# Patient Record
Sex: Male | Born: 1995
Health system: Southern US, Community
[De-identification: ages and names within clinical notes are randomized; demographics above are authoritative.]

## PROBLEM LIST (undated history)

## (undated) ENCOUNTER — Ambulatory Visit: Disposition: A | Discharge status: Home / Self Care | Payer: Self-pay

## (undated) DIAGNOSIS — J45909 Unspecified asthma, uncomplicated: Secondary | ICD-10-CM

## (undated) DIAGNOSIS — R002 Palpitations: Secondary | ICD-10-CM

## (undated) DIAGNOSIS — R079 Chest pain, unspecified: Secondary | ICD-10-CM

## (undated) DIAGNOSIS — F419 Anxiety disorder, unspecified: Secondary | ICD-10-CM

## (undated) HISTORY — DX: Palpitations: R00.2

## (undated) HISTORY — DX: Chest pain, unspecified: R07.9

---
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
## 2014-10-26 DIAGNOSIS — S62346A Nondisplaced fracture of base of fifth metacarpal bone, right hand, initial encounter for closed fracture: Secondary | ICD-10-CM | POA: Insufficient documentation

## 2016-11-07 DIAGNOSIS — S62339A Displaced fracture of neck of unspecified metacarpal bone, initial encounter for closed fracture: Secondary | ICD-10-CM | POA: Insufficient documentation

## 2018-04-11 DIAGNOSIS — T7840XA Allergy, unspecified, initial encounter: Secondary | ICD-10-CM | POA: Diagnosis not present

## 2018-04-11 DIAGNOSIS — Y999 Unspecified external cause status: Secondary | ICD-10-CM | POA: Diagnosis not present

## 2018-04-11 DIAGNOSIS — X58XXXA Exposure to other specified factors, initial encounter: Secondary | ICD-10-CM | POA: Diagnosis not present

## 2018-04-11 DIAGNOSIS — R0602 Shortness of breath: Secondary | ICD-10-CM | POA: Diagnosis not present

## 2018-05-07 DIAGNOSIS — J189 Pneumonia, unspecified organism: Secondary | ICD-10-CM | POA: Diagnosis not present

## 2018-05-07 DIAGNOSIS — R0789 Other chest pain: Secondary | ICD-10-CM | POA: Diagnosis not present

## 2018-05-07 DIAGNOSIS — J181 Lobar pneumonia, unspecified organism: Secondary | ICD-10-CM | POA: Diagnosis not present

## 2018-05-07 DIAGNOSIS — M25512 Pain in left shoulder: Secondary | ICD-10-CM | POA: Diagnosis not present

## 2018-05-09 DIAGNOSIS — R079 Chest pain, unspecified: Secondary | ICD-10-CM | POA: Diagnosis not present

## 2018-07-11 DIAGNOSIS — S93401A Sprain of unspecified ligament of right ankle, initial encounter: Secondary | ICD-10-CM | POA: Diagnosis not present

## 2018-07-26 DIAGNOSIS — S93401A Sprain of unspecified ligament of right ankle, initial encounter: Secondary | ICD-10-CM | POA: Diagnosis not present

## 2018-08-01 DIAGNOSIS — S86311A Strain of muscle(s) and tendon(s) of peroneal muscle group at lower leg level, right leg, initial encounter: Secondary | ICD-10-CM | POA: Diagnosis not present

## 2018-09-09 ENCOUNTER — Emergency Department
Admission: EM | Admit: 2018-09-09 | Discharge: 2018-09-10 | Disposition: A | Discharge status: Home / Self Care | Payer: Self-pay | Attending: Emergency Medicine | Admitting: Emergency Medicine

## 2018-09-09 ENCOUNTER — Other Ambulatory Visit: Payer: Self-pay

## 2018-09-09 ENCOUNTER — Encounter: Payer: Self-pay | Admitting: Emergency Medicine

## 2018-09-09 DIAGNOSIS — R079 Chest pain, unspecified: Secondary | ICD-10-CM | POA: Insufficient documentation

## 2018-09-09 DIAGNOSIS — Z5321 Procedure and treatment not carried out due to patient leaving prior to being seen by health care provider: Secondary | ICD-10-CM | POA: Insufficient documentation

## 2018-09-09 HISTORY — DX: Unspecified asthma, uncomplicated: J45.909

## 2018-09-09 NOTE — ED Triage Notes (Signed)
Patient with complaint of left side chest pain with numbness and tingling to left arm and hand times four days. Patient denies shortness of breath and nausea/vomiting.

## 2018-09-10 LAB — CBC
HCT: 45.5 % (ref 39.0–52.0)
Hemoglobin: 15.6 g/dL (ref 13.0–17.0)
MCH: 31.5 pg (ref 26.0–34.0)
MCHC: 34.3 g/dL (ref 30.0–36.0)
MCV: 91.9 fL (ref 80.0–100.0)
Platelets: 185 10*3/uL (ref 150–400)
RBC: 4.95 MIL/uL (ref 4.22–5.81)
RDW: 13.4 % (ref 11.5–15.5)
WBC: 10.8 10*3/uL — ABNORMAL HIGH (ref 4.0–10.5)
nRBC: 0 % (ref 0.0–0.2)

## 2018-09-10 LAB — BASIC METABOLIC PANEL
Anion gap: 9 (ref 5–15)
BUN: 14 mg/dL (ref 6–20)
CO2: 24 mmol/L (ref 22–32)
Calcium: 8.9 mg/dL (ref 8.9–10.3)
Chloride: 107 mmol/L (ref 98–111)
Creatinine, Ser: 0.79 mg/dL (ref 0.61–1.24)
GFR calc Af Amer: >60 mL/min (ref >60)
GFR calc non Af Amer: >60 mL/min (ref >60)
Glucose, Bld: 95 mg/dL (ref 70–99)
Potassium: 3.8 mmol/L (ref 3.5–5.1)
Sodium: 140 mmol/L (ref 135–145)

## 2018-09-10 LAB — TROPONIN I (HIGH SENSITIVITY): Troponin I (High Sensitivity): 6 ng/L (ref <18)

## 2018-09-10 NOTE — ED Notes (Signed)
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 Pt to stay reports will come back if he needs to. [phone]

## 2018-11-02 ENCOUNTER — Ambulatory Visit
Admission: EM | Admit: 2018-11-02 | Discharge: 2018-11-02 | Disposition: A | Discharge status: Home / Self Care | Payer: BC Managed Care – PPO | Attending: Physician Assistant | Admitting: Physician Assistant

## 2018-11-02 ENCOUNTER — Ambulatory Visit (INDEPENDENT_AMBULATORY_CARE_PROVIDER_SITE_OTHER): Payer: BC Managed Care – PPO

## 2018-11-02 DIAGNOSIS — Z1159 Encounter for screening for other viral diseases: Secondary | ICD-10-CM

## 2018-11-02 DIAGNOSIS — R509 Fever, unspecified: Secondary | ICD-10-CM | POA: Diagnosis not present

## 2018-11-02 DIAGNOSIS — R059 Cough, unspecified: Secondary | ICD-10-CM

## 2018-11-02 DIAGNOSIS — F17200 Nicotine dependence, unspecified, uncomplicated: Secondary | ICD-10-CM | POA: Diagnosis not present

## 2018-11-02 DIAGNOSIS — R05 Cough: Secondary | ICD-10-CM | POA: Diagnosis not present

## 2018-11-02 DIAGNOSIS — R0789 Other chest pain: Secondary | ICD-10-CM | POA: Diagnosis not present

## 2018-11-02 MED ORDER — ONDANSETRON 4 MG PO TBDP: 4.0000 mg | ORAL_TABLET | Freq: Three times a day (TID) | ORAL | 0 refills | Status: DC | PRN

## 2018-11-02 NOTE — ED Triage Notes (Signed)
Pt c/o n/v with fever since Monday. Able to keep fluids down only.

## 2018-11-02 NOTE — ED Provider Notes (Signed)
[name]    CSN: [id number] Arrival date & time: 11/02/18  1356      History   Chief Complaint Chief Complaint  Patient presents with  . Emesis    HPI Stephen Burnett is a 23 y.o. male.   23 year old male comes in for 3-day history of URI symptoms, nausea, vomiting.  Patient states due to smoking, he has baseline cough, but has been slightly worse for the past 3 days with productive cough.  Has mild nasal congestion without sore throat.  T-max 100.4.  Denies chills or body aches.  Denies shortness of breath, wheezing. However, with pleuritic chest pain. States felt this way when he was diagnosed with pneumonia earlier this year.  He has also had nausea with 3 episodes of nonbilious nonbloody vomit.  He has since been able to tolerate fluids.  Denies diarrhea.  Had generalized abdominal pain with vomiting, which has since resolved. Denies sick/COVID contact. 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      Past Medical History:  Diagnosis Date  . Asthma     There are no active problems to display for this patient.   History reviewed. No pertinent surgical history.     Home Medications    Prior to Admission medications   Medication Sig Start Date End Date Taking? Authorizing Provider  ondansetron (ZOFRAN ODT) 4 MG disintegrating tablet Take 1 tablet (4 mg total) by mouth every 8 (eight) hours as needed for nausea or vomiting. 11/02/18   Ok Edwards, PA-C    Family History No family history on file.  Social History Social History   Tobacco Use  . Smoking status: Current Every Day Smoker  . Smokeless tobacco: Current User  Substance Use Topics  . Alcohol use: Yes    Comment: occ  . Drug use: Not Currently    Types: Marijuana, Methamphetamines     Allergies   Banana   Review of Systems Review of Systems  Reason unable to perform ROS: See HPI as above.     Physical Exam Triage Vital Signs ED Triage Vitals  Enc Vitals Group     BP 11/02/18 1417  135/80     Pulse Rate 11/02/18 1417 64     Resp 11/02/18 1417 16     Temp 11/02/18 1417 98.2 F (36.8 C)     Temp Source 11/02/18 1417 Oral     SpO2 11/02/18 1417 98 %     Weight --      Height --      Head Circumference --      Peak Flow --      Pain Score 11/02/18 1418 0     Pain Loc --      Pain Edu? --      Excl. in [place]? --    No data found.  Updated Vital Signs BP 135/80 (BP Location: Left Arm)   Pulse 64   Temp 98.2 F (36.8 C) (Oral)   Resp 16   SpO2 98%   Physical Exam Constitutional:      General: He is not in acute distress.    Appearance: Normal appearance. He is not ill-appearing, toxic-appearing or diaphoretic.  HENT:     Head: Normocephalic and atraumatic.     Mouth/Throat:     Mouth: Mucous membranes are moist.     Pharynx: Oropharynx is clear. Uvula midline.  Neck:     Musculoskeletal: Normal range of motion and neck supple.  Cardiovascular:  Rate and Rhythm: Normal rate and regular rhythm.     Heart sounds: Normal heart sounds. No murmur. No friction rub. No gallop.   Pulmonary:     Effort: Pulmonary effort is normal. No accessory muscle usage, prolonged expiration, respiratory distress or retractions.     Comments: Lungs clear to auscultation without adventitious lung sounds. Abdominal:     General: Bowel sounds are normal.     Palpations: Abdomen is soft.     Tenderness: There is no abdominal tenderness. There is no right CVA tenderness, left CVA tenderness, guarding or rebound.  Neurological:     General: No focal deficit present.     Mental Status: He is alert and oriented to person, place, and time.    UC Treatments / Results  Labs (all labs ordered are listed, but only abnormal results are displayed) Labs Reviewed  NOVEL CORONAVIRUS, NAA    EKG   Radiology Dg Chest 2 View  Result Date: 11/02/2018 CLINICAL DATA:  Patient complains of chest discomfort, emesis, and fever X 3 days. Hx of asthma. Smoker. EXAM: CHEST - 2 VIEW  COMPARISON:  None. FINDINGS: The heart size and mediastinal contours are within normal limits. The lungs are clear. No pneumothorax or pleural effusion. The visualized skeletal structures are unremarkable. IMPRESSION: No active cardiopulmonary disease. Electronically Signed   By: Emmaline Kluver M.D.   On: 11/02/2018 14:41    Procedures Procedures (including critical care time)  Medications Ordered in UC Medications - No data to display  Initial Impression / Assessment and Plan / UC Course  I have reviewed the triage vital signs and the nursing notes.  Pertinent labs & imaging results that were available during my care of the patient were reviewed by me and considered in my medical decision making (see chart for details).    CXR without active cardiopulmonary disease. COVID testing ordered.  Patient to quarantine until testing results return.  Symptomatic treatment discussed.  Push fluids.  Return precautions given.  Patient expresses understanding and agrees to plan.  Final Clinical Impressions(s) / UC Diagnoses   Final diagnoses:  Cough  Fever, unspecified   ED Prescriptions    Medication Sig Dispense Auth. Provider   ondansetron (ZOFRAN ODT) 4 MG disintegrating tablet Take 1 tablet (4 mg total) by mouth every 8 (eight) hours as needed for nausea or vomiting. 20 tablet Belinda Fisher, PA-C     PDMP not reviewed this encounter.   Belinda Fisher, PA-C 11/02/18 1536

## 2018-11-02 NOTE — Discharge Instructions (Addendum)
Chest xray negative for pneumonia. As discussed, cannot rule out COVID. Currently, no alarming signs. Testing ordered. I would like you to quarantine until testing results. Zofran as needed for nausea/vomiting. Bland diet, advance as tolerated. If experiencing shortness of breath, trouble breathing, call 911 and provide them with your current situation.

## 2018-11-04 LAB — NOVEL CORONAVIRUS, NAA: SARS-CoV-2, NAA: NOT DETECTED

## 2018-11-07 ENCOUNTER — Encounter (HOSPITAL_COMMUNITY): Payer: Self-pay

## 2018-11-07 ENCOUNTER — Other Ambulatory Visit: Payer: Self-pay

## 2018-11-07 ENCOUNTER — Ambulatory Visit (HOSPITAL_COMMUNITY)
Admission: EM | Admit: 2018-11-07 | Discharge: 2018-11-07 | Disposition: A | Discharge status: Home / Self Care | Payer: BC Managed Care – PPO | Attending: Family Medicine | Admitting: Family Medicine

## 2018-11-07 DIAGNOSIS — Z720 Tobacco use: Secondary | ICD-10-CM

## 2018-11-07 DIAGNOSIS — J22 Unspecified acute lower respiratory infection: Secondary | ICD-10-CM | POA: Diagnosis not present

## 2018-11-07 MED ORDER — ALBUTEROL SULFATE HFA 108 (90 BASE) MCG/ACT IN AERS: 1.0000 | INHALATION_SPRAY | Freq: Four times a day (QID) | RESPIRATORY_TRACT | 0 refills | Status: DC | PRN

## 2018-11-07 MED ORDER — PREDNISONE 20 MG PO TABS: 40.0000 mg | ORAL_TABLET | Freq: Every day | ORAL | 0 refills | Status: AC

## 2018-11-07 MED ORDER — DOXYCYCLINE HYCLATE 100 MG PO CAPS: 100.0000 mg | ORAL_CAPSULE | Freq: Two times a day (BID) | ORAL | 0 refills | Status: DC

## 2018-11-07 NOTE — ED Provider Notes (Signed)
[facility]    CSN: [id number] Arrival date & time: 11/07/18  1306      History   Chief Complaint Chief Complaint  Patient presents with  . Shortness of Breath    HPI Stephen Burnett is a 23 y.o. male.   Rogerio Boutelle presents with complaints of shortness of breath , pain to chest with deep breathing, productive cough. Dyspnea on exertion. No leg swelling. Some congestion. Feels similar to when he had CAP pneumonia in the past. Still smokes, approximately 1ppd but has cut back while feeling unwell. Symptoms started approximately 1 week ago. Had a fever last week but this has improved. Temp last night of 99. Feels wheezing. History of asthma. Doesn't have an inhaler. Was seen at UC last week, chest xray without acute findings at that time and negative covid.     ROS per HPI, negative if not otherwise mentioned.      Past Medical History:  Diagnosis Date  . Asthma     There are no active problems to display for this patient.   History reviewed. No pertinent surgical history.     Home Medications    Prior to Admission medications   Medication Sig Start Date End Date Taking? Authorizing Provider  albuterol (PROAIR HFA) 108 (90 Base) MCG/ACT inhaler Inhale 1-2 puffs into the lungs every 6 (six) hours as needed for wheezing or shortness of breath. 11/07/18   Zigmund Gottron, NP  doxycycline (VIBRAMYCIN) 100 MG capsule Take 1 capsule (100 mg total) by mouth 2 (two) times daily. 11/07/18   Zigmund Gottron, NP  ondansetron (ZOFRAN ODT) 4 MG disintegrating tablet Take 1 tablet (4 mg total) by mouth every 8 (eight) hours as needed for nausea or vomiting. 11/02/18   Tasia Catchings, Amy V, PA-C  predniSONE (DELTASONE) 20 MG tablet Take 2 tablets (40 mg total) by mouth daily with breakfast for 5 days. 11/07/18 11/12/18  Zigmund Gottron, NP    Family History Family History  Problem Relation Age of Onset  . Healthy Mother     Social History Social History   Tobacco Use  .  Smoking status: Current Every Day Smoker    Packs/day: 1.00    Types: Cigarettes  . Smokeless tobacco: Current User  . Tobacco comment: "smoke like a train when I can"  Substance Use Topics  . Alcohol use: Yes    Comment: occ  . Drug use: Not Currently    Types: Marijuana, Methamphetamines     Allergies   Banana   Review of Systems Review of Systems   Physical Exam Triage Vital Signs ED Triage Vitals  Enc Vitals Group     BP 11/07/18 1318 134/60     Pulse Rate 11/07/18 1318 97     Resp 11/07/18 1318 19     Temp 11/07/18 1318 98.4 F (36.9 C)     Temp Source 11/07/18 1318 Oral     SpO2 11/07/18 1318 99 %     Weight --      Height --      Head Circumference --      Peak Flow --      Pain Score 11/07/18 1317 8     Pain Loc --      Pain Edu? --      Excl. in [place]? --    No data found.  Updated Vital Signs BP 134/60 (BP Location: Left Arm)   Pulse 97   Temp 98.4 F (36.9 C) (Oral)  Resp 19   SpO2 99%    Physical Exam Constitutional:      Appearance: He is well-developed.  Cardiovascular:     Rate and Rhythm: Normal rate and regular rhythm.  Pulmonary:     Effort: Pulmonary effort is normal.     Breath sounds: Decreased breath sounds present. No rhonchi or rales.  Skin:    General: Skin is warm and dry.  Neurological:     Mental Status: He is alert and oriented to person, place, and time.      UC Treatments / Results  Labs (all labs ordered are listed, but only abnormal results are displayed) Labs Reviewed - No data to display  EKG   Radiology No results found.  Procedures Procedures (including critical care time)  Medications Ordered in UC Medications - No data to display  Initial Impression / Assessment and Plan / UC Course  I have reviewed the triage vital signs and the nursing notes.  Pertinent labs & imaging results that were available during my care of the patient were reviewed by me and considered in my medical decision making  (see chart for details).     Afebrile today. No hypoxia. No tachycardia. History  Of asthma and pna, smoker. Decreased lung sounds throughout. Symptoms not improving for approximately the past week. Deferred additional chest xray, will cover with doxycycline, he was prescribed this in March when he had CAP with satisfactory improvement. Steroids and inhaler provided as well to help with chest tightness and prn for wheezing. Return precautions provided. Patient verbalized understanding and agreeable to plan.    Final Clinical Impressions(s) / UC Diagnoses   Final diagnoses:  Lower respiratory tract infection     Discharge Instructions     Push fluids to ensure adequate hydration and keep secretions thin.  Tylenol and/or ibuprofen as needed for pain or fevers.   Complete course of antibiotics.  5 days of steroids and use of inhaler as needed for wheezing or shortness of breath .  If symptoms worsen or do not improve in the next week to return to be seen or to follow up with your PCP.   Decrease to quit smoking to prevent recurrent or persistent cough.    ED Prescriptions    Medication Sig Dispense Auth. Provider   doxycycline (VIBRAMYCIN) 100 MG capsule Take 1 capsule (100 mg total) by mouth 2 (two) times daily. 20 capsule Linus Mako B, NP   predniSONE (DELTASONE) 20 MG tablet Take 2 tablets (40 mg total) by mouth daily with breakfast for 5 days. 10 tablet Linus Mako B, NP   albuterol (PROAIR HFA) 108 (90 Base) MCG/ACT inhaler Inhale 1-2 puffs into the lungs every 6 (six) hours as needed for wheezing or shortness of breath. 8 g Georgetta Haber, NP     PDMP not reviewed this encounter.   Georgetta Haber, NP 11/07/18 1437

## 2018-11-07 NOTE — ED Triage Notes (Signed)
Patient presents to Urgent Care with complaints of shortness of breath and pain in "lungs with deep breaths" since last week. Patient reports he just got his negative COVID test yesterday, chest x-ray last week "showed nothing". Pt does not have inhaler at home.

## 2018-11-07 NOTE — Discharge Instructions (Signed)
Push fluids to ensure adequate hydration and keep secretions thin.  Tylenol and/or ibuprofen as needed for pain or fevers.   Complete course of antibiotics.  5 days of steroids and use of inhaler as needed for wheezing or shortness of breath .  If symptoms worsen or do not improve in the next week to return to be seen or to follow up with your PCP.   Decrease to quit smoking to prevent recurrent or persistent cough.

## 2019-01-03 DIAGNOSIS — J0110 Acute frontal sinusitis, unspecified: Secondary | ICD-10-CM | POA: Diagnosis not present

## 2019-02-19 DIAGNOSIS — Z20822 Contact with and (suspected) exposure to covid-19: Secondary | ICD-10-CM | POA: Diagnosis not present

## 2019-02-19 DIAGNOSIS — J069 Acute upper respiratory infection, unspecified: Secondary | ICD-10-CM | POA: Diagnosis not present

## 2019-05-15 ENCOUNTER — Other Ambulatory Visit: Payer: Self-pay

## 2019-05-15 ENCOUNTER — Encounter: Payer: Self-pay | Admitting: Emergency Medicine

## 2019-05-15 ENCOUNTER — Ambulatory Visit
Admission: EM | Admit: 2019-05-15 | Discharge: 2019-05-15 | Disposition: A | Discharge status: Home / Self Care | Payer: BC Managed Care – PPO | Attending: Emergency Medicine | Admitting: Emergency Medicine

## 2019-05-15 ENCOUNTER — Ambulatory Visit (INDEPENDENT_AMBULATORY_CARE_PROVIDER_SITE_OTHER): Payer: BC Managed Care – PPO

## 2019-05-15 DIAGNOSIS — R0789 Other chest pain: Secondary | ICD-10-CM

## 2019-05-15 DIAGNOSIS — R059 Cough, unspecified: Secondary | ICD-10-CM

## 2019-05-15 DIAGNOSIS — R05 Cough: Secondary | ICD-10-CM

## 2019-05-15 DIAGNOSIS — R079 Chest pain, unspecified: Secondary | ICD-10-CM | POA: Diagnosis not present

## 2019-05-15 MED ORDER — ALBUTEROL SULFATE HFA 108 (90 BASE) MCG/ACT IN AERS: 1.0000 | INHALATION_SPRAY | RESPIRATORY_TRACT | 0 refills | Status: DC | PRN

## 2019-05-15 MED ORDER — PREDNISONE 20 MG PO TABS: 40.0000 mg | ORAL_TABLET | Freq: Every day | ORAL | 0 refills | Status: AC

## 2019-05-15 MED ORDER — AEROCHAMBER PLUS FLO-VU LARGE MISC: 1.0000 | Freq: Once | 0 refills | Status: AC

## 2019-05-15 NOTE — ED Provider Notes (Signed)
[name]    CSN: [id number] Arrival date & time: 05/15/19  1424      History   Chief Complaint Chief Complaint  Patient presents with  . Chest Pain    HPI Stephen Burnett is a 24 y.o. male with history of asthma, tobacco use presenting for left upper chest pain.  States is been occurring for the last 3 days, intermittent.  Patient largely seeking evaluation as he feels this is similar to when he had pneumonia.  Patient seen for this in September 2020: Please see those records, reviewed by me.  Patient currently smoking one-point 5-2 PPD.  Denying increased dyspnea from baseline, though feels he is coughing a little bit more than normal.  Denying known sick contacts, fever, malaise, palpitations, lower extremity edema.  Patient does perform a lot of heavy lifting at work, though states nothing out of the normal has occurred.  Denies trauma, injury.   Past Medical History:  Diagnosis Date  . Asthma     There are no problems to display for this patient.   History reviewed. No pertinent surgical history.     Home Medications    Prior to Admission medications   Medication Sig Start Date End Date Taking? Authorizing Provider  albuterol (PROAIR HFA) 108 (90 Base) MCG/ACT inhaler Inhale 1-2 puffs into the lungs every 4 (four) hours as needed for wheezing or shortness of breath. 05/15/19   Hall-Potvin, [place], PA-C  predniSONE (DELTASONE) 20 MG tablet Take 2 tablets (40 mg total) by mouth daily for 5 days. 05/15/19 05/20/19  Hall-Potvin, [place], PA-C  Spacer/Aero-Holding Chambers (AEROCHAMBER PLUS FLO-VU LARGE) MISC 1 each by Other route once for 1 dose. 05/15/19 05/15/19  Hall-Potvin, [place], PA-C    Family History Family History  Problem Relation Age of Onset  . Healthy Mother     Social History Social History   Tobacco Use  . Smoking status: Current Every Day Smoker    Packs/day: 1.00    Types: Cigarettes  . Smokeless tobacco: Current User  . Tobacco  comment: "smoke like a train when I can"  Substance Use Topics  . Alcohol use: Yes    Comment: occ  . Drug use: Not Currently    Types: Marijuana, Methamphetamines     Allergies   Banana   Review of Systems As per HPI   Physical Exam Triage Vital Signs ED Triage Vitals  Enc Vitals Group     BP      Pulse      Resp      Temp      Temp src      SpO2      Weight      Height      Head Circumference      Peak Flow      Pain Score      Pain Loc      Pain Edu?      Excl. in [place]?    No data found.  Updated Vital Signs BP 128/61 (BP Location: Left Arm) Comment (BP Location): lsrge cuff  Pulse 70   Temp 98.2 F (36.8 C) (Oral)   Resp 20   SpO2 98%   Visual Acuity Right Eye Distance:   Left Eye Distance:   Bilateral Distance:    Right Eye Near:   Left Eye Near:    Bilateral Near:     Physical Exam Constitutional:      General: He is not in acute distress. HENT:  Head: Normocephalic and atraumatic.     Mouth/Throat:     Mouth: Mucous membranes are moist.     Pharynx: Oropharynx is clear.  Eyes:     General: No scleral icterus.    Pupils: Pupils are equal, round, and reactive to light.  Neck:     Comments: Trachea midline, negative JVD Cardiovascular:     Rate and Rhythm: Normal rate and regular rhythm.     Heart sounds: No murmur. No gallop.   Pulmonary:     Effort: Pulmonary effort is normal. No respiratory distress.     Breath sounds: No stridor. No wheezing or rales.     Comments: Decreased breath sounds bilaterally Chest:    Musculoskeletal:     Cervical back: No tenderness.     Comments: Full active ROM of upper extremities bilaterally.  5/5 strength with 2+ DTRs.  Lymphadenopathy:     Cervical: No cervical adenopathy.  Skin:    Capillary Refill: Capillary refill takes less than 2 seconds.     Coloration: Skin is not jaundiced or pale.  Neurological:     Mental Status: He is alert and oriented to person, place, and time.      UC  Treatments / Results  Labs (all labs ordered are listed, but only abnormal results are displayed) Labs Reviewed - No data to display  EKG   Radiology DG Chest 2 View  Result Date: 05/15/2019 CLINICAL DATA:  Cough, chest pain EXAM: CHEST - 2 VIEW COMPARISON:  11/02/2018 FINDINGS: Heart and mediastinal contours are within normal limits. No focal opacities or effusions. No acute bony abnormality. IMPRESSION: No active cardiopulmonary disease. Electronically Signed   By: Rolm Baptise M.D.   On: 05/15/2019 15:16    Procedures Procedures (including critical care time)  Medications Ordered in UC Medications - No data to display  Initial Impression / Assessment and Plan / UC Course  I have reviewed the triage vital signs and the nursing notes.  Pertinent labs & imaging results that were available during my care of the patient were reviewed by me and considered in my medical decision making (see chart for details).     Patient afebrile, nontoxic in office today.  No respiratory distress.  Patient does have reproducible TTP to left upper lateral chest.  Likely musculoskeletal second to patient's line of work.  Given patient's history of smoking, 3-day course of cough with increased sputum production, will treat as bronchitis.  Will use prednisone as this can provide dual therapy for musculoskeletal and respiratory concerns.  Provided refill patient's albuterol inhaler, this time with AeroChamber.  Declined Covid test given lack of exposure or other systemic symptoms, fever.  Return precautions discussed, patient verbalized understanding and is agreeable to plan. Final Clinical Impressions(s) / UC Diagnoses   Final diagnoses:  Cough  Musculoskeletal chest pain     Discharge Instructions     Take steroid as directed with food. Important use albuterol inhaler to keep lungs open. Drink plenty of water, avoid smoking tobacco-try nicotine substitutes x1 to 2 weeks. Important to ice large  joints after heavy use    ED Prescriptions    Medication Sig Dispense Auth. Provider   predniSONE (DELTASONE) 20 MG tablet Take 2 tablets (40 mg total) by mouth daily for 5 days. 10 tablet Hall-Potvin, [place], PA-C   albuterol (PROAIR HFA) 108 (90 Base) MCG/ACT inhaler Inhale 1-2 puffs into the lungs every 4 (four) hours as needed for wheezing or shortness of breath. 18 g Hall-Potvin,  [place], PA-C   Spacer/Aero-Holding Chambers (AEROCHAMBER PLUS FLO-VU LARGE) MISC 1 each by Other route once for 1 dose. 1 each Hall-Potvin, [place], PA-C     PDMP not reviewed this encounter.   Hall-Potvin, [place], [place] 05/15/19 2012

## 2019-05-15 NOTE — Discharge Instructions (Signed)
Take steroid as directed with food. Important use albuterol inhaler to keep lungs open. Drink plenty of water, avoid smoking tobacco-try nicotine substitutes x1 to 2 weeks. Important to ice large joints after heavy use

## 2019-05-15 NOTE — ED Triage Notes (Addendum)
left chest pain for 3 days, intermittent.  Pain is the same as when he had pneumonia.  Denies sob, patient reports cough has more phlegm.  Coughing makes pain worse

## 2019-05-22 ENCOUNTER — Ambulatory Visit
Admission: EM | Admit: 2019-05-22 | Discharge: 2019-05-22 | Disposition: A | Discharge status: Home / Self Care | Payer: BC Managed Care – PPO | Attending: Physician Assistant | Admitting: Physician Assistant

## 2019-05-22 DIAGNOSIS — J209 Acute bronchitis, unspecified: Secondary | ICD-10-CM

## 2019-05-22 MED ORDER — DOXYCYCLINE HYCLATE 100 MG PO CAPS: 100.0000 mg | ORAL_CAPSULE | Freq: Two times a day (BID) | ORAL | 0 refills | Status: DC

## 2019-05-22 NOTE — ED Triage Notes (Signed)
Pt c/o chest congestion, cough and SOB x1 week. States seen and tx last Monday and feels worse.

## 2019-05-22 NOTE — ED Provider Notes (Signed)
[name]    CSN: [id number] Arrival date & time: 05/22/19  1010      History   Chief Complaint Chief Complaint  Patient presents with  . Shortness of Breath    HPI Careem Yasui is a 24 y.o. male.   24 year old male returns to clinic after being seen 05/15/2019.  At that time, he complained of shortness of breath with intermittent chest pain.  Chest x-ray was negative.  He was given prednisone, albuterol.  States some improvement using albuterol, but symptoms continued, and now worsens.  Has continued nasal congestion, worsening cough.  Denies fever, chills, body aches.  Denies abdominal pain, nausea, vomiting, diarrhea.  Denies loss of taste or smell.  States shortness of breath feels as need for frequent shallow breaths, which is worse at night and first thing in the morning. Current every day smoker.      Past Medical History:  Diagnosis Date  . Asthma     There are no problems to display for this patient.   History reviewed. No pertinent surgical history.     Home Medications    Prior to Admission medications   Medication Sig Start Date End Date Taking? Authorizing Provider  albuterol (PROAIR HFA) 108 (90 Base) MCG/ACT inhaler Inhale 1-2 puffs into the lungs every 4 (four) hours as needed for wheezing or shortness of breath. 05/15/19   Hall-Potvin, [place], PA-C  doxycycline (VIBRAMYCIN) 100 MG capsule Take 1 capsule (100 mg total) by mouth 2 (two) times daily. 05/22/19   Ok Edwards, PA-C    Family History Family History  Problem Relation Age of Onset  . Healthy Mother    Social History Social History   Tobacco Use  . Smoking status: Current Every Day Smoker    Packs/day: 1.00    Types: Cigarettes  . Smokeless tobacco: Current User  . Tobacco comment: "smoke like a train when I can"  Substance Use Topics  . Alcohol use: Yes    Comment: occ  . Drug use: Not Currently    Allergies   Banana   Review of Systems Review of Systems    Reason unable to perform ROS: See HPI as above.     Physical Exam Triage Vital Signs ED Triage Vitals  Enc Vitals Group     BP 05/22/19 1025 (!) 141/76     Pulse Rate 05/22/19 1025 66     Resp 05/22/19 1025 18     Temp 05/22/19 1025 98.2 F (36.8 C)     Temp Source 05/22/19 1025 Oral     SpO2 05/22/19 1025 96 %     Weight --      Height --      Head Circumference --      Peak Flow --      Pain Score 05/22/19 1046 7     Pain Loc --      Pain Edu? --      Excl. in [place]? --    No data found.  Updated Vital Signs BP (!) 141/76 (BP Location: Left Arm)   Pulse 66   Temp 98.2 F (36.8 C) (Oral)   Resp 18   SpO2 96%   Physical Exam Constitutional:      General: He is not in acute distress.    Appearance: Normal appearance. He is not ill-appearing, toxic-appearing or diaphoretic.  HENT:     Head: Normocephalic and atraumatic.     Mouth/Throat:     Mouth: Mucous membranes are  moist.     Pharynx: Oropharynx is clear. Uvula midline.  Cardiovascular:     Rate and Rhythm: Normal rate and regular rhythm.     Heart sounds: Normal heart sounds. No murmur. No friction rub. No gallop.   Pulmonary:     Effort: Pulmonary effort is normal. No accessory muscle usage, prolonged expiration, respiratory distress or retractions.     Comments: Speaking in full sentences without difficulty. intermittent expiratory wheezing throughout.  Musculoskeletal:     Cervical back: Normal range of motion and neck supple.  Skin:    General: Skin is warm and dry.  Neurological:     General: No focal deficit present.     Mental Status: He is alert and oriented to person, place, and time.      UC Treatments / Results  Labs (all labs ordered are listed, but only abnormal results are displayed) Labs Reviewed - No data to display  EKG   Radiology No results found.  Procedures Procedures (including critical care time)  Medications Ordered in UC Medications - No data to display  Initial  Impression / Assessment and Plan / UC Course  I have reviewed the triage vital signs and the nursing notes.  Pertinent labs & imaging results that were available during my care of the patient were reviewed by me and considered in my medical decision making (see chart for details).    No alarming signs on exam.  Patient without tachycardia, tachypnea, hypoxia.  He is speaking in full sentences without difficulty, no increased work of breathing. Discussed cannot rule out COVID causing symptoms. Patient deferred testing, states no sick contact.  Patient only using albuterol twice a day at this time, to increase to 4-6 times a day as needed.  Will provide doxycycline to cover for bacterial bronchitis.  Smoking cessation discussed.  Return precautions given.  Final Clinical Impressions(s) / UC Diagnoses   Final diagnoses:  Acute bronchitis, unspecified organism   ED Prescriptions    Medication Sig Dispense Auth. Provider   doxycycline (VIBRAMYCIN) 100 MG capsule Take 1 capsule (100 mg total) by mouth 2 (two) times daily. 14 capsule Belinda Fisher, PA-C     PDMP not reviewed this encounter.   Belinda Fisher, PA-C 05/22/19 1117

## 2019-05-22 NOTE — Discharge Instructions (Signed)
Start doxycycline as directed. Increase albuterol to every 4 hours as needed (can go up to 6 times a day while awake regardless of how many hours it has been). Do 2 puffs before bedtime. Please consider decreasing smoking, as this could be causing symptoms. Please follow up with PCP for further evaluation if symptoms not improving.

## 2019-06-22 ENCOUNTER — Encounter: Payer: Self-pay | Admitting: Internal Medicine

## 2019-06-22 ENCOUNTER — Telehealth (INDEPENDENT_AMBULATORY_CARE_PROVIDER_SITE_OTHER): Payer: BC Managed Care – PPO | Admitting: Internal Medicine

## 2019-06-22 DIAGNOSIS — J45909 Unspecified asthma, uncomplicated: Secondary | ICD-10-CM | POA: Diagnosis not present

## 2019-06-22 DIAGNOSIS — R5383 Other fatigue: Secondary | ICD-10-CM | POA: Diagnosis not present

## 2019-06-22 DIAGNOSIS — F17200 Nicotine dependence, unspecified, uncomplicated: Secondary | ICD-10-CM

## 2019-06-22 DIAGNOSIS — Z7689 Persons encountering health services in other specified circumstances: Secondary | ICD-10-CM

## 2019-06-22 NOTE — Progress Notes (Signed)
Virtual Visit via Telephone Note  I connected with Stephen Burnett, on 06/22/2019 at 1:54 PM by telephone due to the COVID-19 pandemic and verified that I am speaking with the correct person using two identifiers.   Consent: I discussed the limitations, risks, security and privacy concerns of performing an evaluation and management service by telephone and the availability of in person appointments. I also discussed with the patient that there may be a patient responsible charge related to this service. The patient expressed understanding and agreed to proceed.   Location of Patient: Home   Location of Provider: Clinic    Persons participating in Telemedicine visit: Jailen Coward [facility] Dr. Earlene Plater      History of Present Illness: Patient has a visit to establish care. He reports PMH of asthma. He uses Albuterol inhaler. This was recently prescribed by Urgent Care for bronchitis. Patient has concerns about significant family history of DM, heart disease, etc.   Patient reports that he has very low energy. Typically hits him a few hours into the day. This has been occurring for the past 3 weeks. He was recently treated for bronchitis with Doxycyline. His cough has improved significantly and SOB has also improved. He has cut down on how much he is smoking.   Past Medical History:  Diagnosis Date  . Asthma    Allergies  Allergen Reactions  . Banana Anaphylaxis    No current outpatient medications on file prior to visit.   No current facility-administered medications on file prior to visit.    Observations/Objective: NAD. Speaking clearly.  Work of breathing normal.  Alert and oriented. Mood appropriate.   Assessment and Plan: 1. Encounter to establish care Reviewed patient's PMH, social history, surgical history, and medications.  Is overdue for annual exam, screening blood work, and health maintenance topics. Have asked patient to return for visit to address  these items.   2. Uncomplicated asthma, unspecified asthma severity, unspecified whether persistent Continue with Albuterol prn.   3. Fatigue, unspecified type Patient concerned about medical etiology of his fatigue. While this may be related to recent illness, will plan to evaluate further at in person visit with exam and blood work.   4. Tobacco use disorder Continue to work on decreasing amount of smoking.    Follow Up Instructions: Annual exam    I discussed the assessment and treatment plan with the patient. The patient was provided an opportunity to ask questions and all were answered. The patient agreed with the plan and demonstrated an understanding of the instructions.   The patient was advised to call back or seek an in-person evaluation if the symptoms worsen or if the condition fails to improve as anticipated.     I provided 12 minutes total of non-face-to-face time during this encounter including median intraservice time, reviewing previous notes, investigations, ordering medications, medical decision making, coordinating care and patient verbalized understanding at the end of the visit.    Marcy Siren, D.O. Primary Care at [facility]  06/22/2019, 1:54 PM

## 2019-07-07 ENCOUNTER — Telehealth: Payer: Self-pay

## 2019-07-07 DIAGNOSIS — R1012 Left upper quadrant pain: Secondary | ICD-10-CM | POA: Insufficient documentation

## 2019-07-07 DIAGNOSIS — F17210 Nicotine dependence, cigarettes, uncomplicated: Secondary | ICD-10-CM | POA: Diagnosis not present

## 2019-07-07 DIAGNOSIS — M5125 Other intervertebral disc displacement, thoracolumbar region: Secondary | ICD-10-CM | POA: Insufficient documentation

## 2019-07-07 DIAGNOSIS — R748 Abnormal levels of other serum enzymes: Secondary | ICD-10-CM | POA: Diagnosis not present

## 2019-07-07 DIAGNOSIS — R3912 Poor urinary stream: Secondary | ICD-10-CM | POA: Diagnosis not present

## 2019-07-07 DIAGNOSIS — R339 Retention of urine, unspecified: Secondary | ICD-10-CM | POA: Diagnosis not present

## 2019-07-07 NOTE — Patient Instructions (Signed)
Thank you for choosing Primary Care at [facility] to be your medical home!    Stephen Burnett was seen by De Hollingshead, DO today.   Stephen Burnett primary care provider is Marcy Siren, DO.   For the best care possible, you should try to see Marcy Siren, DO whenever you come to the clinic.   We look forward to seeing you again soon!  If you have any questions about your visit today, please call us at [phone] or feel free to reach your primary care provider via MyChart.

## 2019-07-07 NOTE — Telephone Encounter (Signed)
Called patient to do their pre-visit COVID screening.  Have you tested positive for COVID or are you currently waiting for COVID test results? no  Have you recently traveled internationally([place], [place], [place], [place], [place]) or within the [place] to a hotspot area([place], [place], [place], [place], [place])? no  Are you currently experiencing any of the following symptoms: fever, cough, SHOB, fatigue, body aches, loss of smell/taste, rash, diarrhea, vomiting, severe headaches, weakness, sore throat? no  Have you been in contact with anyone who has recently travelled? no  Have you been in contact with anyone who is experiencing any of the above symptoms or been diagnosed with COVID  or works in or has recently visited a SNF? no  Asked to come fasting for CPE labs.

## 2019-07-07 NOTE — ED Triage Notes (Signed)
Pt to ED via POV. Pt states "weird pain feeling when son sat on pt's abdomen". Pt states "he hasn't felt right since then". Pt states he usually urinates a lot but hasn't been able to over the past few days, pt states urine is "the color of apple cider vinegar". Pt reports Left flank pain 7/10.   NAD noted.

## 2019-07-08 ENCOUNTER — Emergency Department: Payer: BC Managed Care – PPO

## 2019-07-08 ENCOUNTER — Emergency Department
Admission: EM | Admit: 2019-07-08 | Discharge: 2019-07-08 | Disposition: A | Discharge status: Home / Self Care | Payer: BC Managed Care – PPO | Attending: Emergency Medicine | Admitting: Emergency Medicine

## 2019-07-08 ENCOUNTER — Other Ambulatory Visit: Payer: Self-pay

## 2019-07-08 DIAGNOSIS — M5124 Other intervertebral disc displacement, thoracic region: Secondary | ICD-10-CM | POA: Diagnosis not present

## 2019-07-08 DIAGNOSIS — R34 Anuria and oliguria: Secondary | ICD-10-CM

## 2019-07-08 DIAGNOSIS — R109 Unspecified abdominal pain: Secondary | ICD-10-CM | POA: Diagnosis not present

## 2019-07-08 DIAGNOSIS — R339 Retention of urine, unspecified: Secondary | ICD-10-CM | POA: Diagnosis not present

## 2019-07-08 DIAGNOSIS — M5126 Other intervertebral disc displacement, lumbar region: Secondary | ICD-10-CM | POA: Diagnosis not present

## 2019-07-08 DIAGNOSIS — R748 Abnormal levels of other serum enzymes: Secondary | ICD-10-CM

## 2019-07-08 LAB — CBC WITH DIFFERENTIAL/PLATELET
Abs Immature Granulocytes: 0.02 10*3/uL (ref 0.00–0.07)
Basophils Absolute: 0.1 10*3/uL (ref 0.0–0.1)
Basophils Relative: 1 %
Eosinophils Absolute: 0.6 10*3/uL — ABNORMAL HIGH (ref 0.0–0.5)
Eosinophils Relative: 6 %
HCT: 44 % (ref 39.0–52.0)
Hemoglobin: 15.1 g/dL (ref 13.0–17.0)
Immature Granulocytes: 0 %
Lymphocytes Relative: 39 %
Lymphs Abs: 3.7 10*3/uL (ref 0.7–4.0)
MCH: 31.5 pg (ref 26.0–34.0)
MCHC: 34.3 g/dL (ref 30.0–36.0)
MCV: 91.7 fL (ref 80.0–100.0)
Monocytes Absolute: 0.7 10*3/uL (ref 0.1–1.0)
Monocytes Relative: 7 %
Neutro Abs: 4.3 10*3/uL (ref 1.7–7.7)
Neutrophils Relative %: 47 %
Platelets: 157 10*3/uL (ref 150–400)
RBC: 4.8 MIL/uL (ref 4.22–5.81)
RDW: 12.4 % (ref 11.5–15.5)
WBC: 9.3 10*3/uL (ref 4.0–10.5)
nRBC: 0 % (ref 0.0–0.2)

## 2019-07-08 LAB — URINALYSIS, COMPLETE (UACMP) WITH MICROSCOPIC
Bacteria, UA: NONE SEEN
Bilirubin Urine: NEGATIVE
Glucose, UA: NEGATIVE mg/dL
Hgb urine dipstick: NEGATIVE
Ketones, ur: NEGATIVE mg/dL
Leukocytes,Ua: NEGATIVE
Nitrite: NEGATIVE
Protein, ur: NEGATIVE mg/dL
Specific Gravity, Urine: 1.029 (ref 1.005–1.030)
pH: 5 (ref 5.0–8.0)

## 2019-07-08 LAB — BASIC METABOLIC PANEL
Anion gap: 7 (ref 5–15)
BUN: 14 mg/dL (ref 6–20)
CO2: 28 mmol/L (ref 22–32)
Calcium: 9.4 mg/dL (ref 8.9–10.3)
Chloride: 101 mmol/L (ref 98–111)
Creatinine, Ser: 0.94 mg/dL (ref 0.61–1.24)
GFR calc Af Amer: >60 mL/min (ref >60)
GFR calc non Af Amer: >60 mL/min (ref >60)
Glucose, Bld: 99 mg/dL (ref 70–99)
Potassium: 4.1 mmol/L (ref 3.5–5.1)
Sodium: 136 mmol/L (ref 135–145)

## 2019-07-08 LAB — LIPASE, BLOOD: Lipase: 97 U/L — ABNORMAL HIGH (ref 11–51)

## 2019-07-08 MED ORDER — SODIUM CHLORIDE 0.9 % IV BOLUS
1000.0000 mL | Freq: Once | INTRAVENOUS | Status: AC
  Administered 2019-07-08: 1000 mL via INTRAVENOUS

## 2019-07-08 NOTE — ED Provider Notes (Signed)
[facility] Emergency Department Provider Note  ____________________________________________  Time seen: Approximately 7:55 AM  I have reviewed the triage vital signs and the nursing notes.   HISTORY  Chief Complaint Urinary Retention    HPI Stephen Burnett is a 24 y.o. male that presents to the emergency department for evaluation of left upper quadrant pain and decreased urinary frequency for 2 days.  Patient states that his urine is the color of apple cider vinegar.  He usually gets up in the middle of the night to urinate 10-12 times and states that last night he only got up 3 times.  No saddle anesthesias.  Patient has just set up an appointment with primary care to establish care.  No fever, nausea, vomiting, back pain, leg pain.   Past Medical History:  Diagnosis Date  . Asthma     Patient Active Problem List   Diagnosis Date Noted  . Asthma 06/22/2019    History reviewed. No pertinent surgical history.  Prior to Admission medications   Not on File    Allergies Banana  Family History  Problem Relation Age of Onset  . Healthy Mother     Social History Social History   Tobacco Use  . Smoking status: Current Every Day Smoker    Packs/day: 0.50    Types: Cigarettes  . Smokeless tobacco: Former Systems developer  . Tobacco comment: "smoke like a train when I can"  Substance Use Topics  . Alcohol use: Not Currently    Comment: rarely   . Drug use: Not Currently     Review of Systems  Constitutional: No fever/chills Respiratory: No SOB. Gastrointestinal: Positive for abdominal discomfort.  No nausea, no vomiting.  Genitourinary: Negative for dysuria.  Positive for decreased urinary frequency. Musculoskeletal: Negative for musculoskeletal pain. Skin: Negative for rash, abrasions, lacerations, ecchymosis. Neurological: Negative for headaches, numbness or tingling   ____________________________________________   PHYSICAL EXAM:  VITAL  SIGNS: ED Triage Vitals  Enc Vitals Group     BP 07/08/19 0000 (!) 143/62     Pulse Rate 07/08/19 0000 73     Resp 07/08/19 0000 18     Temp 07/08/19 0000 98.6 F (37 C)     Temp Source 07/08/19 0000 Oral     SpO2 07/08/19 0000 98 %     Weight 07/08/19 0002 (!) 320 lb (145.2 kg)     Height 07/08/19 0002 6\' 3"  (1.905 m)     Head Circumference --      Peak Flow --      Pain Score 07/08/19 0001 7     Pain Loc --      Pain Edu? --      Excl. in [place]? --      Constitutional: Alert and oriented. Well appearing and in no acute distress. Eyes: Conjunctivae are normal. PERRL. EOMI. Head: Atraumatic. ENT:      Ears:      Nose: No congestion/rhinnorhea.      Mouth/Throat: Mucous membranes are moist.  Neck: No stridor.  Cardiovascular: Normal rate, regular rhythm.  Good peripheral circulation. Respiratory: Normal respiratory effort without tachypnea or retractions. Lungs CTAB. Good air entry to the bases with no decreased or absent breath sounds. Gastrointestinal: Bowel sounds 4 quadrants.  Mild tenderness to palpation to left upper quadrant/left flank. No guarding or rigidity. No palpable masses. No distention.  Musculoskeletal: Full range of motion to all extremities. No gross deformities appreciated.  No tenderness to palpation over thoracic or lumbar spine.  Strength equal in lower extremities bilaterally.  Normal gait. Neurologic:  Normal speech and language. No gross focal neurologic deficits are appreciated.  Skin:  Skin is warm, dry and intact. No rash noted. Psychiatric: Mood and affect are normal. Speech and behavior are normal. Patient exhibits appropriate insight and judgement.   ____________________________________________   LABS (all labs ordered are listed, but only abnormal results are displayed)  Labs Reviewed  URINALYSIS, COMPLETE (UACMP) WITH MICROSCOPIC - Abnormal; Notable for the following components:      Result Value   Color, Urine YELLOW (*)    APPearance  HAZY (*)    All other components within normal limits  CBC WITH DIFFERENTIAL/PLATELET - Abnormal; Notable for the following components:   Eosinophils Absolute 0.6 (*)    All other components within normal limits  LIPASE, BLOOD - Abnormal; Notable for the following components:   Lipase 97 (*)    All other components within normal limits  BASIC METABOLIC PANEL   ____________________________________________  EKG   ____________________________________________  RADIOLOGY   MR THORACIC SPINE WO CONTRAST  Result Date: 07/08/2019 CLINICAL DATA:  Urinary retention. Disc protrusion noted at T11-12 on CT scan of the abdomen and pelvis dated 07/08/2019. EXAM: MRI THORACIC SPINE WITHOUT CONTRAST TECHNIQUE: Multiplanar, multisequence MR imaging of the thoracic spine was performed. No intravenous contrast was administered. COMPARISON:  CT scan dated 07/08/2019 FINDINGS: Alignment:  Physiologic. Vertebrae: No fracture, evidence of discitis, or bone lesion. Cord: No mass lesion or myelopathy. Slight compression of the ventral aspect of the right side of the spinal cord at T11-12 as described below. Paraspinal and other soft tissues: Negative. Disc levels: C7-T1 through T3-4: Normal. T4-5: Small disc bulge and osteophyte into the left neural foramen without neural impingement. Otherwise negative. T5-6: Tiny central subligamentous disc bulge with no neural impingement. T6-7: Normal. T7-8: Normal. T8-9: Tiny central disc bulge with no neural impingement. T9-10: Normal. T10-11: Normal. T11-12: Disc space narrowing. Small chronic calcified disc protrusion with accompanying osteophyte central and to the right slightly compressing the ventral aspect of the spinal cord without myelopathy. No foraminal stenosis. T12-L1: Normal. IMPRESSION: 1. Small chronic calcified disc protrusion with accompanying osteophyte central and to the right at T11-12 slightly compressing the ventral aspect of the spinal cord without  myelopathy. It is unlikely that this is the source of the patient's urinary retention. 2. No other significant abnormalities. Electronically Signed   By: Francene Boyers M.D.   On: 07/08/2019 10:09   MR LUMBAR SPINE WO CONTRAST  Result Date: 07/08/2019 CLINICAL DATA:  Urinary retention.  Left flank pain. EXAM: MRI LUMBAR SPINE WITHOUT CONTRAST TECHNIQUE: Multiplanar, multisequence MR imaging of the lumbar spine was performed. No intravenous contrast was administered. COMPARISON:  CT scan of the abdomen and pelvis dated 07/08/2019 FINDINGS: Segmentation:  5 lumbar type vertebra. Alignment:  Physiologic. Vertebrae:  No fracture, evidence of discitis, or bone lesion. Conus medullaris and cauda equina: Conus extends to the L1 level. Conus and cauda equina appear normal. Paraspinal and other soft tissues: Negative. Disc levels: T12-L1 through L2-3: Normal. L3-4: Tiny broad-based disc bulge which is likely physiologic. No neural impingement. L4-5: Normal. L5-S1: Normal. IMPRESSION: Normal MRI of the lumbar spine. Electronically Signed   By: Francene Boyers M.D.   On: 07/08/2019 09:42   CT Renal Stone Study  Result Date: 07/08/2019 CLINICAL DATA:  Abdominal pain and abnormal urine EXAM: CT ABDOMEN AND PELVIS WITHOUT CONTRAST TECHNIQUE: Multidetector CT imaging of the abdomen and pelvis was performed following the  standard protocol without IV contrast. COMPARISON:  None. FINDINGS: LOWER CHEST: Normal. HEPATOBILIARY: Normal hepatic contours. No intra- or extrahepatic biliary dilatation. The gallbladder is normal. PANCREAS: Normal pancreas. No ductal dilatation or peripancreatic fluid collection. SPLEEN: Normal. ADRENALS/URINARY TRACT: The adrenal glands are normal. No hydronephrosis, nephroureterolithiasis or solid renal mass. The urinary bladder is normal for degree of distention STOMACH/BOWEL: There is no hiatal hernia. Normal duodenal course and caliber. No small bowel dilatation or inflammation. No focal colonic  abnormality. Normal appendix. VASCULAR/LYMPHATIC: Normal course and caliber of the major abdominal vessels. No abdominal or pelvic lymphadenopathy. REPRODUCTIVE: Normal prostate size with symmetric seminal vesicles. MUSCULOSKELETAL. Moderate-to-severe spinal canal stenosis at T11-12 secondary to partially calcified disc bulge. OTHER: None. IMPRESSION: 1. No acute abnormality of the abdomen or pelvis. 2. Moderate-to-severe spinal canal stenosis at T11-12 secondary to partially calcified disc bulge. Electronically Signed   By: Deatra Robinson M.D.   On: 07/08/2019 02:17    ____________________________________________    PROCEDURES  Procedure(s) performed:    Procedures    Medications  sodium chloride 0.9 % bolus 1,000 mL (0 mLs Intravenous Stopped 07/08/19 1058)     ____________________________________________   INITIAL IMPRESSION / ASSESSMENT AND PLAN / ED COURSE  Pertinent labs & imaging results that were available during my care of the patient were reviewed by me and considered in my medical decision making (see chart for details).  Review of the [place] CSRS was performed in accordance of the [facility] prior to dispensing any controlled drugs.   Differential diagnosis includes, but is not limited to, biliary disease (biliary colic, acute cholecystitis, cholangitis, choledocholithiasis, etc), intrathoracic causes for epigastric abdominal pain including ACS, gastritis, duodenitis, pancreatitis, small bowel or large bowel obstruction, abdominal aortic aneurysm, hernia, ulcer(s), cauda equina, urinary tract infection, nephrolithiasis, mass.   ----------------------------------------- 7:33 AM on 07/08/2019 -----------------------------------------  Patient presented to the emergency department for evaluation of left upper quadrant pain and decreased urinary frequency for 2 days.  Lab work ordered in triage largely unremarkable.  Lipase ordered and is pending.  CT renal stone study shows no  acute abnormality of the abdomen and pelvis but shows a moderate to severe spinal canal stenosis at T11-12. Patient denies any back pain.   ----------------------------------------- 8:00 AM on 07/08/2019 -----------------------------------------  Patient is up to use the restroom for post void residual volume measurement.  He feels he has emptied his bladder. Post void mL 26.  MRI of the T and L-spine ordered to rule out cauda equina, although I think this is unlikely as patient's post void volume is 26 and he does feel like he completely emptied his bladder.  Patient denies any back pain.  ----------------------------------------- 8:19 AM on 07/08/2019 -----------------------------------------  Patient is up to use the bathroom again.  ----------------------------------------- 10:00 sentient AM on 07/08/2019 -----------------------------------------  MRI lumbar spine is normal.  MRI T-spine shows a small chronic calcified disc protrusion with accompanying osteophyte central and to the right at T11-12 slightly compressing the ventral aspect of the spinal cord without myelopathy and is unlikely that this is the source of patient's urinary retention per radiology.  Patient will follow up with urology for urinary symptoms.  Patient will push fluids and drink clear liquids this weekend for slightly elevated lipase.  Patient is to follow up with primary care and urology as directed. Patient is given ED precautions to return to the ED for any worsening or new symptoms.     ____________________________________________  FINAL CLINICAL IMPRESSION(S) / ED DIAGNOSES  Final diagnoses:  Decreased urination  Elevated lipase      NEW MEDICATIONS STARTED DURING THIS VISIT:  ED Discharge Orders    None          This chart was dictated using voice recognition software/Dragon. Despite best efforts to proofread, errors can occur which can change the meaning. Any change was purely  unintentional.    Enid Derry, PA-C 07/08/19 1522    Jene Every, MD 07/08/19 [phone]

## 2019-07-08 NOTE — Discharge Instructions (Signed)
Your lipase is a little elevated, which means you are at risk for pancreatitis. Please push lots of fluids this weekend. Please call primary care on Tuesday to establish care. Please also follow up with urology.

## 2019-07-08 NOTE — ED Notes (Signed)
MRI on phone with patient to ask screening questions.

## 2019-07-11 ENCOUNTER — Encounter: Payer: Self-pay | Admitting: Internal Medicine

## 2019-07-11 ENCOUNTER — Ambulatory Visit (INDEPENDENT_AMBULATORY_CARE_PROVIDER_SITE_OTHER): Payer: BC Managed Care – PPO | Admitting: Internal Medicine

## 2019-07-11 VITALS — BP 129/80 | HR 64 | Temp 97.7°F | Resp 17 | Ht 71.0 in | Wt 301.0 lb

## 2019-07-11 DIAGNOSIS — R946 Abnormal results of thyroid function studies: Secondary | ICD-10-CM | POA: Diagnosis not present

## 2019-07-11 DIAGNOSIS — Z833 Family history of diabetes mellitus: Secondary | ICD-10-CM

## 2019-07-11 DIAGNOSIS — R5383 Other fatigue: Secondary | ICD-10-CM | POA: Diagnosis not present

## 2019-07-11 DIAGNOSIS — Z114 Encounter for screening for human immunodeficiency virus [HIV]: Secondary | ICD-10-CM

## 2019-07-11 DIAGNOSIS — R34 Anuria and oliguria: Secondary | ICD-10-CM

## 2019-07-11 DIAGNOSIS — Z113 Encounter for screening for infections with a predominantly sexual mode of transmission: Secondary | ICD-10-CM

## 2019-07-11 DIAGNOSIS — Z0000 Encounter for general adult medical examination without abnormal findings: Secondary | ICD-10-CM | POA: Diagnosis not present

## 2019-07-11 NOTE — Progress Notes (Signed)
Subjective:    Stephen Burnett - 24 y.o. male MRN [id number]  Date of birth: 1995-09-26  HPI  Duff Pozzi is here for annual exam.  Fatigue: Has been present for about 5 weeks now. Has been occurring ever since recent bout of bronchitis. Wants to make sure nothing medically wrong. No unintended weight changes, night sweats, fevers.   Patient seen in ED 5/29 for decreased urination. Reports he has been drinking lots of fluids and urine color is less dark and he seems to be urinating a more normal amount. He also presented with LUQ and was found to have lipase of 98. He reports pain has improved.    Health Maintenance:  Health Maintenance Due  Topic Date Due   HIV Screening  Never done   TETANUS/TDAP  Never done    -  reports that he has been smoking cigarettes. He has been smoking about 0.50 packs per day. He has quit using smokeless tobacco. - Review of Systems: Per HPI. - Past Medical History: Patient Active Problem List   Diagnosis Date Noted   Asthma 06/22/2019   - Medications: reviewed and updated   Objective:   Physical Exam BP 129/80    Pulse 64    Temp 97.7 F (36.5 C) (Temporal)    Resp 17    Ht 5\' 11"  (1.803 m)    Wt (!) 301 lb (136.5 kg)    SpO2 95%    BMI 41.98 kg/m  Physical Exam  Constitutional: He is oriented to person, place, and time and well-developed, well-nourished, and in no distress.  HENT:  Head: Normocephalic and atraumatic.  Mouth/Throat: Oropharynx is clear and moist.  TMs normal bilaterally   Eyes: Pupils are equal, round, and reactive to light. Conjunctivae and EOM are normal.  Neck: No thyromegaly present.  Cardiovascular: Normal rate, regular rhythm, normal heart sounds and intact distal pulses.  No murmur heard. Pulmonary/Chest: Effort normal and breath sounds normal. No respiratory distress. He has no wheezes.  Abdominal: Soft. Bowel sounds are normal. He exhibits no distension. There is no abdominal tenderness. There is no rebound and  no guarding.  Musculoskeletal:        General: No deformity or edema. Normal range of motion.     Cervical back: Normal range of motion and neck supple.  Lymphadenopathy:    He has no cervical adenopathy.  Neurological: He is alert and oriented to person, place, and time. Gait normal.  Skin: Skin is warm and dry. No rash noted. He is not diaphoretic.  Psychiatric: Mood, affect and judgment normal.           Assessment & Plan:   1. Annual physical exam Counseled on 150 minutes of exercise per week, healthy eating (including decreased daily intake of saturated fats, cholesterol, added sugars, sodium), STI prevention, routine healthcare maintenance. - CBC with Differential - Lipid Panel  2. Screening for HIV (human immunodeficiency virus) - HIV antibody (with reflex)  3. Family history of diabetes mellitus - Hemoglobin A1c  4. Decreased urination Improving. Potentially related to decreased PO intake with mild pancreatitis. Kidney function was normal at ED. UA did not show any signs of infection. CT renal study negative. MRI thoracic spine did show T11-T12 disc space narrowing with small chronic calcified disc protrusion and osteophyte slightly compressing spinal cord---thought to be unlikely source of urinary retention. Will check GC/Chlamydia and also refer to urology per patient request/ED advice for further work up.  - Ambulatory referral to Urology - GC/Chlamydia  Probe Amp([facility])  5. Fatigue, unspecified type May be related to subacute post viral illness given time course. Will check for other etiologies.  - TSH - Vitamin B12 - VITAMIN D 25 Hydroxy (Vit-D Deficiency, Fractures)    Phill Myron, D.O. 07/11/2019, 8:53 AM Primary Care at [facility]

## 2019-07-12 LAB — CBC WITH DIFFERENTIAL/PLATELET
Basophils Absolute: 0.1 10*3/uL (ref 0.0–0.2)
Basos: 2 %
EOS (ABSOLUTE): 0.5 10*3/uL — ABNORMAL HIGH (ref 0.0–0.4)
Eos: 7 %
Hematocrit: 46.5 % (ref 37.5–51.0)
Hemoglobin: 15.5 g/dL (ref 13.0–17.7)
Immature Grans (Abs): 0 10*3/uL (ref 0.0–0.1)
Immature Granulocytes: 0 %
Lymphocytes Absolute: 2.4 10*3/uL (ref 0.7–3.1)
Lymphs: 35 %
MCH: 31.1 pg (ref 26.6–33.0)
MCHC: 33.3 g/dL (ref 31.5–35.7)
MCV: 93 fL (ref 79–97)
Monocytes Absolute: 0.5 10*3/uL (ref 0.1–0.9)
Monocytes: 8 %
Neutrophils Absolute: 3.3 10*3/uL (ref 1.4–7.0)
Neutrophils: 48 %
Platelets: 177 10*3/uL (ref 150–450)
RBC: 4.98 x10E6/uL (ref 4.14–5.80)
RDW: 12.8 % (ref 11.6–15.4)
WBC: 6.8 10*3/uL (ref 3.4–10.8)

## 2019-07-12 LAB — LIPID PANEL
Chol/HDL Ratio: 4.8 ratio (ref 0.0–5.0)
Cholesterol, Total: 169 mg/dL (ref 100–199)
HDL: 35 mg/dL — ABNORMAL LOW (ref >39)
LDL Chol Calc (NIH): 113 mg/dL — ABNORMAL HIGH (ref 0–99)
Triglycerides: 116 mg/dL (ref 0–149)
VLDL Cholesterol Cal: 21 mg/dL (ref 5–40)

## 2019-07-12 LAB — HEMOGLOBIN A1C
Est. average glucose Bld gHb Est-mCnc: 108 mg/dL
Hgb A1c MFr Bld: 5.4 % (ref 4.8–5.6)

## 2019-07-12 LAB — TSH: TSH: 4.59 u[IU]/mL — ABNORMAL HIGH (ref 0.450–4.500)

## 2019-07-12 LAB — VITAMIN D 25 HYDROXY (VIT D DEFICIENCY, FRACTURES): Vit D, 25-Hydroxy: 28.9 ng/mL — ABNORMAL LOW (ref 30.0–100.0)

## 2019-07-12 LAB — VITAMIN B12: Vitamin B-12: 759 pg/mL (ref 232–1245)

## 2019-07-12 LAB — HIV ANTIBODY (ROUTINE TESTING W REFLEX): HIV Screen 4th Generation wRfx: NONREACTIVE

## 2019-07-13 LAB — GC/CHLAMYDIA PROBE AMP
Chlamydia trachomatis, NAA: NEGATIVE
Neisseria Gonorrhoeae by PCR: NEGATIVE

## 2019-07-13 LAB — T3, FREE: T3, Free: 3.1 pg/mL (ref 2.0–4.4)

## 2019-07-13 LAB — T4, FREE: Free T4: 0.9 ng/dL (ref 0.82–1.77)

## 2019-07-13 LAB — SPECIMEN STATUS REPORT

## 2019-07-14 NOTE — Progress Notes (Signed)
Patient notified of results & recommendations. Expressed understanding.

## 2019-08-08 ENCOUNTER — Ambulatory Visit (INDEPENDENT_AMBULATORY_CARE_PROVIDER_SITE_OTHER): Payer: BC Managed Care – PPO

## 2019-08-08 ENCOUNTER — Other Ambulatory Visit: Payer: Self-pay

## 2019-08-08 ENCOUNTER — Ambulatory Visit
Admission: EM | Admit: 2019-08-08 | Discharge: 2019-08-08 | Disposition: A | Discharge status: Home / Self Care | Payer: BC Managed Care – PPO

## 2019-08-08 DIAGNOSIS — J189 Pneumonia, unspecified organism: Secondary | ICD-10-CM | POA: Diagnosis not present

## 2019-08-08 DIAGNOSIS — R05 Cough: Secondary | ICD-10-CM

## 2019-08-08 DIAGNOSIS — R079 Chest pain, unspecified: Secondary | ICD-10-CM | POA: Diagnosis not present

## 2019-08-08 MED ORDER — AZITHROMYCIN 250 MG PO TABS
250.0000 mg | ORAL_TABLET | Freq: Every day | ORAL | 0 refills | Status: DC
Start: 2019-08-08 — End: 2019-09-06

## 2019-08-08 MED ORDER — AEROCHAMBER PLUS FLO-VU MEDIUM MISC: 1.0000 | Freq: Once | 0 refills | Status: AC

## 2019-08-08 MED ORDER — ALBUTEROL SULFATE HFA 108 (90 BASE) MCG/ACT IN AERS: 2.0000 | INHALATION_SPRAY | Freq: Four times a day (QID) | RESPIRATORY_TRACT | 0 refills | Status: DC | PRN

## 2019-08-08 MED ORDER — AMOXICILLIN-POT CLAVULANATE 875-125 MG PO TABS: 1.0000 | ORAL_TABLET | Freq: Two times a day (BID) | ORAL | 0 refills | Status: DC

## 2019-08-08 NOTE — ED Triage Notes (Signed)
Pt c/o pain to upper chest, both shoulders and neck/back with coughing. "It feels the same as when I had pneumonia," cough non productive

## 2019-08-08 NOTE — ED Provider Notes (Signed)
[name]    CSN: [id number] Arrival date & time: 08/08/19  1748      History   Chief Complaint Chief Complaint  Patient presents with  . Cough    HPI Stephen Burnett is a 24 y.o. male history of asthma presenting for bilateral chest pain.  States it radiates to both shoulders and back of neck.  Worse with coughing.  States has been coughing for the last week: Nonproductive.  No shortness of breath, chest palpitations, fever, weakness, vomiting or diarrhea.  No lower leg swelling.  Has taken OTC cold medications without relief.   Past Medical History:  Diagnosis Date  . Asthma     Patient Active Problem List   Diagnosis Date Noted  . Asthma 06/22/2019    History reviewed. No pertinent surgical history.     Home Medications    Prior to Admission medications   Medication Sig Start Date End Date Taking? Authorizing Provider  albuterol (VENTOLIN HFA) 108 (90 Base) MCG/ACT inhaler Inhale 2 puffs into the lungs every 6 (six) hours as needed for wheezing or shortness of breath. 08/08/19   Hall-Potvin, [place], PA-C  amoxicillin-clavulanate (AUGMENTIN) 875-125 MG tablet Take 1 tablet by mouth every 12 (twelve) hours. 08/08/19   Hall-Potvin, [place], PA-C  azithromycin (ZITHROMAX) 250 MG tablet Take 1 tablet (250 mg total) by mouth daily. Take first 2 tablets together, then 1 every day until finished. 08/08/19   Hall-Potvin, [place], PA-C    Family History Family History  Problem Relation Age of Onset  . Healthy Mother     Social History Social History   Tobacco Use  . Smoking status: Current Every Day Smoker    Packs/day: 0.50    Types: Cigarettes  . Smokeless tobacco: Former Neurosurgeon  . Tobacco comment: "smoke like a train when I can"  Vaping Use  . Vaping Use: Never used  Substance Use Topics  . Alcohol use: Not Currently    Comment: rarely   . Drug use: Not Currently     Allergies   Banana   Review of Systems As per HPI   Physical  Exam Triage Vital Signs ED Triage Vitals  Enc Vitals Group     BP      Pulse      Resp      Temp      Temp src      SpO2      Weight      Height      Head Circumference      Peak Flow      Pain Score      Pain Loc      Pain Edu?      Excl. in GC?    No data found.  Updated Vital Signs BP (!) 142/81 (BP Location: Left Arm)   Pulse 83   Temp 99.8 F (37.7 C) (Oral)   Resp 20   SpO2 97%   Visual Acuity Right Eye Distance:   Left Eye Distance:   Bilateral Distance:    Right Eye Near:   Left Eye Near:    Bilateral Near:     Physical Exam Constitutional:      General: He is not in acute distress. HENT:     Head: Normocephalic and atraumatic.  Eyes:     General: No scleral icterus.    Pupils: Pupils are equal, round, and reactive to light.  Cardiovascular:     Rate and Rhythm: Normal rate.  Pulmonary:  Effort: Pulmonary effort is normal. No respiratory distress.     Breath sounds: No wheezing.  Skin:    Coloration: Skin is not jaundiced or pale.  Neurological:     Mental Status: He is alert and oriented to person, place, and time.      UC Treatments / Results  Labs (all labs ordered are listed, but only abnormal results are displayed) Labs Reviewed  NOVEL CORONAVIRUS, NAA    EKG   Radiology DG Chest 2 View  Result Date: 08/08/2019 CLINICAL DATA:  Cough for 1 week.  Chest pain. EXAM: CHEST - 2 VIEW COMPARISON:  Radiograph 05/15/2019 FINDINGS: Streaky opacity at the left lung base involving the lingula and lower lobes. Right lung is clear. Normal heart size and mediastinal contours. Minor central bronchial thickening, similar to prior. No pulmonary edema, pleural effusion, or pneumothorax. No acute osseous abnormalities are seen. IMPRESSION: Streaky left lung base opacity involving the lingula and lower lobes, atelectasis versus pneumonia. Electronically Signed   By: Narda Rutherford M.D.   On: 08/08/2019 19:04    Procedures Procedures (including  critical care time)  Medications Ordered in UC Medications - No data to display  Initial Impression / Assessment and Plan / UC Course  I have reviewed the triage vital signs and the nursing notes.  Pertinent labs & imaging results that were available during my care of the patient were reviewed by me and considered in my medical decision making (see chart for details).     Patient afebrile, nontoxic, with SpO2 97%.  Covid PCR pending.  Patient to quarantine until results are back.  Chest x-ray done office, reviewed by me radiology: Streaky left lung base opacity involving the lingula and lower lobes suggest atelectasis VS pneumonia.  Patient's history of pneumonia with similar chest pain, will treat for CAP as outlined below.  Return precautions discussed, patient verbalized understanding and is agreeable to plan. Final Clinical Impressions(s) / UC Diagnoses   Final diagnoses:  Pneumonia of left lower lobe due to infectious organism     Discharge Instructions     Take antibiotic twice daily with food. Take Z-Pak as prescribed: 2 tabs on day 1, 1 tab each day thereafter. Use inhaler as needed for chest tightness or difficulty breathing. Go to the ER for worsening chest pain, cough, difficulty breathing, lightheadedness, dizziness.    ED Prescriptions    Medication Sig Dispense Auth. Provider   albuterol (VENTOLIN HFA) 108 (90 Base) MCG/ACT inhaler Inhale 2 puffs into the lungs every 6 (six) hours as needed for wheezing or shortness of breath. 18 g Hall-Potvin, [place], PA-C   Spacer/Aero-Holding Chambers (AEROCHAMBER PLUS FLO-VU MEDIUM) MISC 1 each by Other route once for 1 dose. 1 each Hall-Potvin, [place], PA-C   amoxicillin-clavulanate (AUGMENTIN) 875-125 MG tablet Take 1 tablet by mouth every 12 (twelve) hours. 14 tablet Hall-Potvin, [place], PA-C   azithromycin (ZITHROMAX) 250 MG tablet Take 1 tablet (250 mg total) by mouth daily. Take first 2 tablets together, then 1 every  day until finished. 6 tablet Hall-Potvin, [place], PA-C     PDMP not reviewed this encounter.   Hall-Potvin, [place], [place] 08/09/19 1220

## 2019-08-08 NOTE — Discharge Instructions (Signed)
Take antibiotic twice daily with food. Take Z-Pak as prescribed: 2 tabs on day 1, 1 tab each day thereafter. Use inhaler as needed for chest tightness or difficulty breathing. Go to the ER for worsening chest pain, cough, difficulty breathing, lightheadedness, dizziness.

## 2019-08-09 ENCOUNTER — Encounter: Payer: Self-pay | Admitting: Emergency Medicine

## 2019-08-09 ENCOUNTER — Telehealth: Payer: Self-pay | Admitting: Internal Medicine

## 2019-08-09 DIAGNOSIS — J189 Pneumonia, unspecified organism: Secondary | ICD-10-CM

## 2019-08-09 NOTE — Telephone Encounter (Signed)
Can you let patient know that pulmonology referral was signed. Office will contact him to set up an appointment. They will also be able to give him the pneumonia vaccine once his current pneumonia has cleared up.

## 2019-08-09 NOTE — Telephone Encounter (Signed)
Signed the pulmonology referral.   Thanks!  Marcy Siren, D.O. Primary Care at [facility]  08/09/2019, 12:11 PM

## 2019-08-09 NOTE — Telephone Encounter (Signed)
Called pt lvm .

## 2019-08-09 NOTE — Telephone Encounter (Signed)
Please advise on referral. If agreeable to referral request the Pulmonology referral is pending & they'll be able to give Pneumonia vaccine.  If not we can schedule a nurse visit for vaccine & possibly an appointment with you or Dr. Delford Field to discuss recurring Pneumonia.

## 2019-08-09 NOTE — Telephone Encounter (Signed)
Patient returned Quianna's call and was informed of the nurse message regarding his back and neck pain. Patient went to the UC and was informed to contact his PCP so he could get referred out to see a pulmonologist. Patient is also requesting a pneumonia shot because he states he has had pneumonia more then 2 times this year. Please f/u

## 2019-08-10 LAB — NOVEL CORONAVIRUS, NAA: SARS-CoV-2, NAA: NOT DETECTED

## 2019-08-10 LAB — SARS-COV-2, NAA 2 DAY TAT

## 2019-09-06 ENCOUNTER — Other Ambulatory Visit: Payer: Self-pay

## 2019-09-06 ENCOUNTER — Ambulatory Visit (INDEPENDENT_AMBULATORY_CARE_PROVIDER_SITE_OTHER): Payer: Self-pay

## 2019-09-06 ENCOUNTER — Ambulatory Visit (HOSPITAL_COMMUNITY)
Admission: EM | Admit: 2019-09-06 | Discharge: 2019-09-06 | Disposition: A | Discharge status: Home / Self Care | Payer: Self-pay | Attending: Family Medicine | Admitting: Family Medicine

## 2019-09-06 ENCOUNTER — Encounter (HOSPITAL_COMMUNITY): Payer: Self-pay

## 2019-09-06 ENCOUNTER — Ambulatory Visit
Admission: EM | Admit: 2019-09-06 | Discharge: 2019-09-06 | Discharge status: Absent without leave | Payer: BC Managed Care – PPO

## 2019-09-06 DIAGNOSIS — J90 Pleural effusion, not elsewhere classified: Secondary | ICD-10-CM | POA: Diagnosis not present

## 2019-09-06 DIAGNOSIS — R05 Cough: Secondary | ICD-10-CM | POA: Diagnosis not present

## 2019-09-06 DIAGNOSIS — R0602 Shortness of breath: Secondary | ICD-10-CM

## 2019-09-06 DIAGNOSIS — R21 Rash and other nonspecific skin eruption: Secondary | ICD-10-CM | POA: Diagnosis not present

## 2019-09-06 DIAGNOSIS — R059 Cough, unspecified: Secondary | ICD-10-CM

## 2019-09-06 DIAGNOSIS — I517 Cardiomegaly: Secondary | ICD-10-CM | POA: Diagnosis not present

## 2019-09-06 MED ORDER — LEVOFLOXACIN 750 MG PO TABS
750.0000 mg | ORAL_TABLET | Freq: Every day | ORAL | 0 refills | Status: DC
Start: 2019-09-06 — End: 2019-10-06

## 2019-09-06 MED ORDER — HYDROCODONE-HOMATROPINE 5-1.5 MG/5ML PO SYRP: 5.0000 mL | ORAL_SOLUTION | Freq: Four times a day (QID) | ORAL | 0 refills | Status: DC | PRN

## 2019-09-06 NOTE — ED Triage Notes (Signed)
Pt presents with complaints of shortness of breath and cough x 3 days. Pt states that he feels like he has pneumonia. He has had it multiple times this year. Pt endorses chest and back pain as well.

## 2019-09-09 NOTE — ED Provider Notes (Signed)
[facility]   [id number] 09/06/19 Arrival Time: 1812  ASSESSMENT & PLAN:  1. Cough   2. SOB (shortness of breath)      I have personally viewed the imaging studies ordered this visit. Bibasilar haziness.  Will tx for PNA given history.  Meds ordered this encounter  Medications   levofloxacin (LEVAQUIN) 750 MG tablet    Sig: Take 1 tablet (750 mg total) by mouth daily.    Dispense:  7 tablet    Refill:  0   HYDROcodone-homatropine (HYCODAN) 5-1.5 MG/5ML syrup    Sig: Take 5 mLs by mouth every 6 (six) hours as needed for cough.    Dispense:  90 mL    Refill:  0      Follow-up Information    [facility] EMERGENCY DEPARTMENT.   Specialty: Emergency Medicine Why: If symptoms worsen in any way. Contact information: [address] [phone] mc [place] [address] [phone]             [place] Controlled Substances Registry consulted for this patient. I feel the risk/benefit ratio today is favorable for proceeding with this prescription for a controlled substance. Medication sedation precautions given.   Reviewed expectations re: course of current medical issues. Questions answered. Outlined signs and symptoms indicating need for more acute intervention. Understanding verbalized. After Visit Summary given.   SUBJECTIVE: History from: patient. Stephen Burnett is a 24 y.o. male who reports couhging and SOB x 3 days; "feels like when I get pneumonia". H/O frequent PNA. Known COVID-19 contact: none. Recent travel: none. Questions subj fever. Fatigued. Normal PO intake without n/v/d.    OBJECTIVE:  Vitals:   09/06/19 1821  BP: (!) 153/77  Pulse: 103  Resp: (!) 24  Temp: 99.4 F (37.4 C)  SpO2: 97%    General appearance: alert; no distress Eyes: PERRLA; EOMI; conjunctiva normal HENT: [place]; AT; mild nasal congestion Neck: supple  Lungs: speaks full sentences without difficulty; unlabored; recheck RR  18 Extremities: no edema Skin: warm and dry Neurologic: normal gait Psychological: alert and cooperative; normal mood and affect   Imaging: DG Chest 2 View  Result Date: 09/06/2019 CLINICAL DATA:  25 year old male with cough and shortness of breath. EXAM: CHEST - 2 VIEW COMPARISON:  Chest radiograph dated 08/08/2019 FINDINGS: Bibasilar linear and streaky densities may represent atelectasis or infiltrate. Clinical correlation is recommended. Trace bilateral pleural effusions may be present. No pneumothorax. Mild cardiomegaly. No acute osseous pathology. IMPRESSION: 1. Bibasilar atelectasis or infiltrate. 2. Mild cardiomegaly. Electronically Signed   By: Elgie Collard M.D.   On: 09/06/2019 18:59    Allergies  Allergen Reactions   Banana Anaphylaxis    Past Medical History:  Diagnosis Date   Asthma    Social History   Socioeconomic History   Marital status: Married    Spouse name: Not on file   Number of children: Not on file   Years of education: Not on file   Highest education level: Not on file  Occupational History   Not on file  Tobacco Use   Smoking status: Current Every Day Smoker    Packs/day: 0.50    Types: Cigarettes   Smokeless tobacco: Former Neurosurgeon   Tobacco comment: "smoke like a train when I can"  Vaping Use   Vaping Use: Never used  Substance and Sexual Activity   Alcohol use: Not Currently    Comment: rarely    Drug use: Not Currently   Sexual activity: Not on  file  Other Topics Concern   Not on file  Social History Narrative   Not on file   Social Determinants of Health   Financial Resource Strain:    Difficulty of Paying Living Expenses:   Food Insecurity:    Worried About Programme researcher, broadcasting/film/video in the Last Year:    Barista in the Last Year:   Transportation Needs:    Freight forwarder (Medical):    Lack of Transportation (Non-Medical):   Physical Activity:    Days of Exercise per Week:    Minutes of  Exercise per Session:   Stress:    Feeling of Stress :   Social Connections:    Frequency of Communication with Friends and Family:    Frequency of Social Gatherings with Friends and Family:    Attends Religious Services:    Active Member of Clubs or Organizations:    Attends Engineer, structural:    Marital Status:   Intimate Partner Violence:    Fear of Current or Ex-Partner:    Emotionally Abused:    Physically Abused:    Sexually Abused:    Family History  Problem Relation Age of Onset   Healthy Mother    Heart attack Father    History reviewed. No pertinent surgical history.   Mardella Layman, MD 09/09/19 1027

## 2019-10-03 ENCOUNTER — Ambulatory Visit (HOSPITAL_COMMUNITY): Payer: Self-pay

## 2019-10-06 ENCOUNTER — Ambulatory Visit (HOSPITAL_COMMUNITY)
Admission: EM | Admit: 2019-10-06 | Discharge: 2019-10-06 | Disposition: A | Discharge status: Home / Self Care | Payer: Self-pay

## 2019-10-06 ENCOUNTER — Ambulatory Visit (INDEPENDENT_AMBULATORY_CARE_PROVIDER_SITE_OTHER): Payer: Self-pay

## 2019-10-06 ENCOUNTER — Other Ambulatory Visit: Payer: Self-pay

## 2019-10-06 ENCOUNTER — Ambulatory Visit
Admission: EM | Admit: 2019-10-06 | Discharge: 2019-10-06 | Disposition: A | Discharge status: Home / Self Care | Payer: Self-pay | Attending: Emergency Medicine | Admitting: Emergency Medicine

## 2019-10-06 DIAGNOSIS — R05 Cough: Secondary | ICD-10-CM

## 2019-10-06 DIAGNOSIS — R059 Cough, unspecified: Secondary | ICD-10-CM

## 2019-10-06 DIAGNOSIS — J189 Pneumonia, unspecified organism: Secondary | ICD-10-CM | POA: Diagnosis not present

## 2019-10-06 DIAGNOSIS — Z8701 Personal history of pneumonia (recurrent): Secondary | ICD-10-CM

## 2019-10-06 DIAGNOSIS — Z720 Tobacco use: Secondary | ICD-10-CM

## 2019-10-06 MED ORDER — BENZONATATE 100 MG PO CAPS: 100.0000 mg | ORAL_CAPSULE | Freq: Three times a day (TID) | ORAL | 0 refills | Status: DC

## 2019-10-06 MED ORDER — FLUTICASONE PROPIONATE 50 MCG/ACT NA SUSP
1.0000 | Freq: Every day | NASAL | 0 refills | Status: DC
Start: 2019-10-06 — End: 2020-06-13

## 2019-10-06 MED ORDER — CETIRIZINE HCL 10 MG PO TABS
10.0000 mg | ORAL_TABLET | Freq: Every day | ORAL | 0 refills | Status: DC
Start: 2019-10-06 — End: 2020-06-13

## 2019-10-06 MED ORDER — CYCLOBENZAPRINE HCL 5 MG PO TABS: 5.0000 mg | ORAL_TABLET | Freq: Two times a day (BID) | ORAL | 0 refills | Status: AC | PRN

## 2019-10-06 NOTE — ED Triage Notes (Signed)
Pt presents with complaints of cough x 2 days. Reports he feels like he has pneumonia again. Reports frequent pneumonia this year and is having difficulty getting in with a lung specialist. Pt is not concerned for covid.

## 2019-10-06 NOTE — ED Provider Notes (Signed)
[name]    CSN: [id number] Arrival date & time: 10/06/19  1133      History   Chief Complaint Chief Complaint  Patient presents with  . Cough    HPI Stephen Burnett is a 24 y.o. male   Subjective:   Stephen Burnett is a 24 y.o. male here for evaluation of a cough.  The cough is non-productive, without wheezing, dyspnea or hemoptysis and is aggravated by fumes. Onset of symptoms was 2 days ago, stable since that time.  Associated symptoms include sputum production. Patient does not have a history of asthma. Patient has not had recent travel. Patient does have a history of smoking. Patient  has not had a previous chest x-ray. Patient has not had a PPD done. The following portions of the patient's history were reviewed and updated as appropriate: allergies, current medications, past family history, past medical history, past social history, past surgical history and problem list.  Patient has decreased smoking from 2 packs/day to 1 pack/day.  Finished course of Levaquin and July.  Has been seen twice in the last few months for pneumonia.     Past Medical History:  Diagnosis Date  . Asthma     Patient Active Problem List   Diagnosis Date Noted  . Asthma 06/22/2019    History reviewed. No pertinent surgical history.     Home Medications    Prior to Admission medications   Medication Sig Start Date End Date Taking? Authorizing Provider  albuterol (VENTOLIN HFA) 108 (90 Base) MCG/ACT inhaler Inhale 2 puffs into the lungs every 6 (six) hours as needed for wheezing or shortness of breath. 08/08/19   Hall-Potvin, [place], PA-C  benzonatate (TESSALON) 100 MG capsule Take 1 capsule (100 mg total) by mouth every 8 (eight) hours. 10/06/19   Hall-Potvin, [place], PA-C  cetirizine (ZYRTEC ALLERGY) 10 MG tablet Take 1 tablet (10 mg total) by mouth daily. 10/06/19   Hall-Potvin, [place], PA-C  cyclobenzaprine (FLEXERIL) 5 MG tablet Take 1 tablet (5 mg total) by mouth 2  (two) times daily as needed for up to 7 days for muscle spasms. 10/06/19 10/13/19  Hall-Potvin, [place], PA-C  fluticasone (FLONASE) 50 MCG/ACT nasal spray Place 1 spray into both nostrils daily. 10/06/19   Hall-Potvin, [place], PA-C    Family History Family History  Problem Relation Age of Onset  . Healthy Mother   . Heart attack Father     Social History Social History   Tobacco Use  . Smoking status: Current Every Day Smoker    Packs/day: 0.50    Types: Cigarettes  . Smokeless tobacco: Former Neurosurgeon  . Tobacco comment: "smoke like a train when I can"  Vaping Use  . Vaping Use: Never used  Substance Use Topics  . Alcohol use: Not Currently    Comment: rarely   . Drug use: Not Currently     Allergies   Banana   Review of Systems As per HPI   Physical Exam Triage Vital Signs ED Triage Vitals  Enc Vitals Group     BP 10/06/19 1213 137/76     Pulse Rate 10/06/19 1213 63     Resp 10/06/19 1213 (!) 22     Temp 10/06/19 1213 98.2 F (36.8 C)     Temp src --      SpO2 10/06/19 1213 97 %     Weight --      Height --      Head Circumference --  Peak Flow --      Pain Score 10/06/19 1212 10     Pain Loc --      Pain Edu? --      Excl. in GC? --    No data found.  Updated Vital Signs BP 137/76   Pulse 63   Temp 98.2 F (36.8 C)   Resp (!) 22   SpO2 97%   Visual Acuity Right Eye Distance:   Left Eye Distance:   Bilateral Distance:    Right Eye Near:   Left Eye Near:    Bilateral Near:     Physical Exam Constitutional:      General: He is not in acute distress.    Appearance: He is not toxic-appearing or diaphoretic.  HENT:     Head: Normocephalic and atraumatic.     Mouth/Throat:     Mouth: Mucous membranes are moist.     Pharynx: Oropharynx is clear.  Eyes:     General: No scleral icterus.    Conjunctiva/sclera: Conjunctivae normal.     Pupils: Pupils are equal, round, and reactive to light.  Neck:     Comments: Trachea midline, negative  JVD Cardiovascular:     Rate and Rhythm: Normal rate and regular rhythm.  Pulmonary:     Effort: Pulmonary effort is normal. No respiratory distress.     Breath sounds: No wheezing.  Musculoskeletal:     Cervical back: Neck supple. No tenderness.  Lymphadenopathy:     Cervical: No cervical adenopathy.  Skin:    Capillary Refill: Capillary refill takes less than 2 seconds.     Coloration: Skin is not jaundiced or pale.     Findings: No rash.  Neurological:     Mental Status: He is alert and oriented to person, place, and time.      UC Treatments / Results  Labs (all labs ordered are listed, but only abnormal results are displayed) Labs Reviewed - No data to display  EKG   Radiology DG Chest 2 View  Result Date: 10/06/2019 CLINICAL DATA:  Cough for 2 days, frequent pneumonia this year, brown phlegm EXAM: CHEST - 2 VIEW COMPARISON:  09/06/2019 FINDINGS: Upper normal heart size. Mediastinal contours and pulmonary vascularity normal. Lungs clear. Previously identified bibasilar opacities improved. No pleural effusion or pneumothorax. Osseous structures unremarkable. IMPRESSION: No acute abnormalities. Electronically Signed   By: Ulyses Southward M.D.   On: 10/06/2019 12:39    Procedures Procedures (including critical care time)  Medications Ordered in UC Medications - No data to display  Initial Impression / Assessment and Plan / UC Course  I have reviewed the triage vital signs and the nursing notes.  Pertinent labs & imaging results that were available during my care of the patient were reviewed by me and considered in my medical decision making (see chart for details).     Plan:  Antitussives per medication orders. Avoid exposure to tobacco smoke and fumes. B-agonist inhaler. Call if shortness of breath worsens, blood in sputum, change in character of cough, development of fever or chills, inability to maintain nutrition and hydration. Avoid exposure to tobacco smoke and  fumes. CXR improved/negative Final Clinical Impressions(s) / UC Diagnoses   Final diagnoses:  Cough  Tobacco use     Discharge Instructions     Take cough medication as prescribed. May use inhaler as needed for difficulty breathing, chest tightness. Very important that you follow-up with lung doctor: You are responsible for calling them to set up  an appointment. Go to the ER for worsening cough or you develop chest pain, difficulty breathing.    ED Prescriptions    Medication Sig Dispense Auth. Provider   benzonatate (TESSALON) 100 MG capsule Take 1 capsule (100 mg total) by mouth every 8 (eight) hours. 21 capsule Hall-Potvin, [place], PA-C   cetirizine (ZYRTEC ALLERGY) 10 MG tablet Take 1 tablet (10 mg total) by mouth daily. 30 tablet Hall-Potvin, [place], PA-C   fluticasone (FLONASE) 50 MCG/ACT nasal spray Place 1 spray into both nostrils daily. 16 g Hall-Potvin, [place], PA-C   cyclobenzaprine (FLEXERIL) 5 MG tablet Take 1 tablet (5 mg total) by mouth 2 (two) times daily as needed for up to 7 days for muscle spasms. 14 tablet Hall-Potvin, [place], PA-C     PDMP not reviewed this encounter.   Hall-Potvin, [place], [place] 10/06/19 1402

## 2019-10-06 NOTE — Discharge Instructions (Addendum)
Take cough medication as prescribed. May use inhaler as needed for difficulty breathing, chest tightness. Very important that you follow-up with lung doctor: You are responsible for calling them to set up an appointment. Go to the ER for worsening cough or you develop chest pain, difficulty breathing.

## 2019-12-11 ENCOUNTER — Institutional Professional Consult (permissible substitution): Payer: Self-pay | Admitting: Pulmonary Disease

## 2020-02-19 DIAGNOSIS — Z20828 Contact with and (suspected) exposure to other viral communicable diseases: Secondary | ICD-10-CM | POA: Diagnosis not present

## 2020-03-08 DIAGNOSIS — Z03818 Encounter for observation for suspected exposure to other biological agents ruled out: Secondary | ICD-10-CM | POA: Diagnosis not present

## 2020-03-08 DIAGNOSIS — R6889 Other general symptoms and signs: Secondary | ICD-10-CM | POA: Diagnosis not present

## 2020-03-08 DIAGNOSIS — J019 Acute sinusitis, unspecified: Secondary | ICD-10-CM | POA: Diagnosis not present

## 2020-03-08 DIAGNOSIS — U071 COVID-19: Secondary | ICD-10-CM | POA: Diagnosis not present

## 2020-03-08 DIAGNOSIS — J209 Acute bronchitis, unspecified: Secondary | ICD-10-CM | POA: Diagnosis not present

## 2020-03-14 ENCOUNTER — Institutional Professional Consult (permissible substitution): Payer: Self-pay | Admitting: Pulmonary Disease

## 2020-03-28 ENCOUNTER — Ambulatory Visit (INDEPENDENT_AMBULATORY_CARE_PROVIDER_SITE_OTHER): Payer: BC Managed Care – PPO | Admitting: Internal Medicine

## 2020-03-28 ENCOUNTER — Other Ambulatory Visit: Payer: Self-pay

## 2020-03-28 ENCOUNTER — Encounter: Payer: Self-pay | Admitting: Internal Medicine

## 2020-03-28 VITALS — BP 112/72 | HR 53 | Temp 97.7°F | Ht 75.0 in | Wt 278.8 lb

## 2020-03-28 DIAGNOSIS — Z8709 Personal history of other diseases of the respiratory system: Secondary | ICD-10-CM

## 2020-03-28 DIAGNOSIS — Z8701 Personal history of pneumonia (recurrent): Secondary | ICD-10-CM | POA: Diagnosis not present

## 2020-03-28 DIAGNOSIS — Z8616 Personal history of COVID-19: Secondary | ICD-10-CM

## 2020-03-28 DIAGNOSIS — R0602 Shortness of breath: Secondary | ICD-10-CM | POA: Diagnosis not present

## 2020-03-28 DIAGNOSIS — R059 Cough, unspecified: Secondary | ICD-10-CM | POA: Diagnosis not present

## 2020-03-28 NOTE — Patient Instructions (Addendum)
History of recurrent pneumonia History of bronchiolitis History of asthma History of 2019 novel coronavirus disease (COVID-19)   -Unclear what exactly has triggered the record pneumonia between 2019 and 2021 but the bulk of it being in 2021.  Could be due to work in the external environment -being exposed to cold and different chemicals  -Unclear what if any, prior asthma is playing a role.  This could all be just recurrent asthma  -Seems is no history of aspiration no ongoing mold exposure [although used to feather pillow as a child] a poor dentition contributing to recurrent pneumonia history  Plan -Do high-resolution CT chest supine and prone -Do blood Rast allergy panel and IgE -Do full pulmonary function test -Do exam nitric oxide test otherwise call Feno  Follow-up -Return to see nurse practitioner after the above to discuss test results and next steps

## 2020-03-28 NOTE — Progress Notes (Signed)
OV 03/28/2020  Subjective:  Patient ID: Stephen Burnett, male , DOB: Dec 01, 1995 , age 25 y.o. , MRN: [id number] , ADDRESS: [address] [place] [address] PCP Arvilla Market, DO Patient Care Team: Arvilla Market, DO as PCP - General (Family Medicine)  This Provider for this visit: Treatment Team:  Attending Provider: Martina Sinner, MD    03/28/2020 -   Chief Complaint  Patient presents with  . Consult    Had PNA 6 times in last year     HPI Stephen Burnett 4 y.o. --year-old male.  He works in different jobs.  He tells me that his job involves working in the external environment and cold weather and also exposed to different chemicals and sometimes swamp.  He tells me that in 2019 he had his first pneumonia and then after that in 2020 but then multiple episodes in 2021.  He has different chest x-rays most of them are clear but some of them show streaky opacities.  I personally visualized some of them.  His last pneumonia episode was in summer 2021.  Then in January 2022 he had COVID-19 despite being vaccinated.  He took some preventive antibiotics and prednisone.  He says that all his pneumonia episodes have been characterized by wheezing and treatment with antibiotics/prednisone and this would help.  He gives a history of bronchiolitis from RSV at age 60 months.  Since then he had asthma till age 11 and he thought he outgrew it.  However in 2021 his blood eosinophils were very high.  He is not aware that he might have asthma recently.  Nobody has told him that.  He said up until 3 or 4 years ago he was involved heavily with substance abuse but now is in remission.   Results for Stephen, Burnett (MRN [id number]) as of 03/28/2020 11:06  Ref. Range 07/08/2019 05:25  Eosinophils Absolute Latest Ref Range: 0.0 - 0.5 K/uL 0.6 (H)     Multiple chest x-rays -September 2020: Chest x-ray report is clear -April 2021: Chest x-ray reported as no cardiovascular  abnormalities -June 2021: Chest x-ray reported as lingular abnormalities atelectasis versus pneumonia -July 2021: Chest x-ray reported as bibasilar atelectasis -August 2021: Chest x-ray reported as no acute infiltrates  SARS-CoV2 BD Positive (A)Comment: This is a corrected result. Previous result was Negative on 03/08/2020 at 1205 EST      has a past medical history of Asthma.   reports that he has been smoking cigarettes. He has been smoking about 0.50 packs per day. He has quit using smokeless tobacco.  History reviewed. No pertinent surgical history.  Allergies  Allergen Reactions  . Banana Anaphylaxis    Immunization History  Administered Date(s) Administered  . PFIZER(Purple Top)SARS-COV-2 Vaccination 06/05/2019, 06/27/2019    Family History  Problem Relation Age of Onset  . Healthy Mother   . Heart attack Father      Current Outpatient Medications:  .  albuterol (VENTOLIN HFA) 108 (90 Base) MCG/ACT inhaler, Inhale 2 puffs into the lungs every 6 (six) hours as needed for wheezing or shortness of breath., Disp: 18 g, Rfl: 0 .  benzonatate (TESSALON) 100 MG capsule, Take 1 capsule (100 mg total) by mouth every 8 (eight) hours., Disp: 21 capsule, Rfl: 0 .  cetirizine (ZYRTEC ALLERGY) 10 MG tablet, Take 1 tablet (10 mg total) by mouth daily., Disp: 30 tablet, Rfl: 0 .  fluticasone (FLONASE) 50 MCG/ACT nasal spray, Place 1 spray into both nostrils daily.,  Disp: 16 g, Rfl: 0      Objective:   Vitals:   03/28/20 1047  BP: 112/72  Pulse: (!) 53  Temp: 97.7 F (36.5 C)  TempSrc: Oral  SpO2: 99%  Weight: 278 lb 12.8 oz (126.5 kg)  Height: 6\' 3"  (1.905 m)    Estimated body mass index is 34.85 kg/m as calculated from the following:   Height as of this encounter: 6\' 3"  (1.905 m).   Weight as of this encounter: 278 lb 12.8 oz (126.5 kg).  @WEIGHTCHANGE @    03/28/20 1047  Weight: 278 lb 12.8 oz (126.5 kg)     Physical Exam  General Appearance:     Alert, cooperative, no distress, appears stated age -looks well , Deconditioned looking - no , OBESE  - yes, Sitting on Wheelchair -  no  Head:    Normocephalic, without obvious abnormality, atraumatic  Eyes:    PERRL, conjunctiva/corneas clear,  Ears:    Normal TM's and external ear canals, both ears  Nose:   Nares normal, septum midline, mucosa normal, no drainage    or sinus tenderness. OXYGEN ON  - no . Patient is @ ra   Throat:   Lips, mucosa, and tongue normal; teeth and gums normal. Cyanosis on lips - no  Neck:   Supple, symmetrical, trachea midline, no adenopathy;    thyroid:  no enlargement/tenderness/nodules; no carotid   bruit or JVD  Back:     Symmetric, no curvature, ROM normal, no CVA tenderness  Lungs:     Distress - no , Wheeze no, Barrell Chest - no, Purse lip breathing - no, Crackles - no   Chest Wall:    No tenderness or deformity.    Heart:    Regular rate and rhythm, S1 and S2 normal, no rub   or gallop, Murmur - no  Breast Exam:    NOT DONE  Abdomen:     Soft, non-tender, bowel sounds active all four quadrants,    no masses, no organomegaly. Visceral obesity - mild  Genitalia:   NOT DONE  Rectal:   NOT DONE  Extremities:   Extremities - normal, Has Cane - no, Clubbing - no, Edema - no  Pulses:   2+ and symmetric all extremities  Skin:   Stigmata of Connective Tissue Disease - no  Lymph nodes:   Cervical, supraclavicular, and axillary nodes normal  Psychiatric:  Neurologic:   Pleasant - yes, Anxious - no, Flat affect - no  CAm-ICU - neg, Alert and Oriented x 3 - yes, Moves all 4s - yes, Speech - normal, Cognition - intact       Assessment:       ICD-10-CM   1. History of recurrent pneumonia  Z87.01   2. History of bronchiolitis  Z87.09   3. History of asthma  Z87.09   4. History of 2019 novel coronavirus disease (COVID-19)  Z86.16        Plan:     Patient Instructions  History of recurrent pneumonia History of bronchiolitis History of asthma History  of 2019 novel coronavirus disease (COVID-19)   -Unclear what exactly has triggered the record pneumonia between 2019 and 2021 but the bulk of it being in 2021.  Could be due to work in the external environment -being exposed to cold and different chemicals  -Unclear what if any, prior asthma is playing a role.  This could all be just recurrent asthma  -Seems is no history of aspiration  no ongoing mold exposure [although used to feather pillow as a child] a poor dentition contributing to recurrent pneumonia history  Plan -Do high-resolution CT chest supine and prone -Do blood Rast allergy panel and IgE -Do full pulmonary function test -Do exam nitric oxide test otherwise call Feno  Follow-up -Return to see nurse practitioner after the above to discuss test results and next steps      SIGNATURE    Dr. Kalman Shan, M.D., F.C.C.P,  Pulmonary and Critical Care Medicine Staff Physician, [facility] Director - Interstitial Lung Disease  Program  Pulmonary Fibrosis [facility] [place], [place], [address]  Pager: [phone], If no answer or between  15:00h - 7:00h: call [phone] Telephone: [phone]  11:28 AM 03/28/2020

## 2020-04-02 LAB — ALLERGEN PROFILE, PERENNIAL ALLERGEN IGE
Alternaria Alternata IgE: 1.34 kU/L — AB
Aspergillus Fumigatus IgE: 0.45 kU/L — AB
Aureobasidi Pullulans IgE: 0.91 kU/L — AB
Candida Albicans IgE: 2.04 kU/L — AB
Cat Dander IgE: 2.24 kU/L — AB
Chicken Feathers IgE: <0.1 kU/L
Cladosporium Herbarum IgE: 0.25 kU/L — AB
Cow Dander IgE: 0.13 kU/L — AB
D Farinae IgE: 3.18 kU/L — AB
D Pteronyssinus IgE: 3.31 kU/L — AB
Dog Dander IgE: 0.84 kU/L — AB
Duck Feathers IgE: <0.1 kU/L
Goose Feathers IgE: <0.1 kU/L
Mouse Urine IgE: 8.98 kU/L — AB
Mucor Racemosus IgE: <0.1 kU/L
Penicillium Chrysogen IgE: <0.1 kU/L
Phoma Betae IgE: 1.35 kU/L — AB
Setomelanomma Rostrat: 2.36 kU/L — AB
Stemphylium Herbarum IgE: 2.34 kU/L — AB

## 2020-04-03 ENCOUNTER — Other Ambulatory Visit (INDEPENDENT_AMBULATORY_CARE_PROVIDER_SITE_OTHER): Payer: BC Managed Care – PPO

## 2020-04-03 ENCOUNTER — Telehealth: Payer: Self-pay | Admitting: Internal Medicine

## 2020-04-03 DIAGNOSIS — J45909 Unspecified asthma, uncomplicated: Secondary | ICD-10-CM | POA: Diagnosis not present

## 2020-04-03 LAB — CBC WITH DIFFERENTIAL/PLATELET
Basophils Absolute: 0.1 10*3/uL (ref 0.0–0.1)
Basophils Relative: 1.2 % (ref 0.0–3.0)
Eosinophils Absolute: 0.8 10*3/uL — ABNORMAL HIGH (ref 0.0–0.7)
Eosinophils Relative: 9.4 % — ABNORMAL HIGH (ref 0.0–5.0)
HCT: 43.9 % (ref 39.0–52.0)
Hemoglobin: 14.9 g/dL (ref 13.0–17.0)
Lymphocytes Relative: 32.2 % (ref 12.0–46.0)
Lymphs Abs: 2.7 10*3/uL (ref 0.7–4.0)
MCHC: 33.9 g/dL (ref 30.0–36.0)
MCV: 92.7 fl (ref 78.0–100.0)
Monocytes Absolute: 0.5 10*3/uL (ref 0.1–1.0)
Monocytes Relative: 5.9 % (ref 3.0–12.0)
Neutro Abs: 4.3 10*3/uL (ref 1.4–7.7)
Neutrophils Relative %: 51.3 % (ref 43.0–77.0)
Platelets: 159 10*3/uL (ref 150.0–400.0)
RBC: 4.74 Mil/uL (ref 4.22–5.81)
RDW: 14.1 % (ref 11.5–15.5)
WBC: 8.4 10*3/uL (ref 4.0–10.5)

## 2020-04-03 MED ORDER — BREO ELLIPTA 100-25 MCG/INH IN AEPB
1.0000 | INHALATION_SPRAY | Freq: Every day | RESPIRATORY_TRACT | 0 refills | Status: DC
Start: 2020-04-03 — End: 2020-04-24

## 2020-04-03 NOTE — Telephone Encounter (Signed)
I called and spoke with pt and televisit scheduled with BW on 3/16 at 9.  Pt is aware and nothing further is needed

## 2020-04-03 NOTE — Telephone Encounter (Signed)
He should a telephone visit with an app to see how he is repsonding to [facility] in 3 weeks Still keep PFT in may 2022 but also ensure FeNO

## 2020-04-03 NOTE — Telephone Encounter (Signed)
Samples placed up front 

## 2020-04-03 NOTE — Telephone Encounter (Signed)
Spoke with the pt and notified of results/recs per MR  He verbalized understanding  Labs ordered and triage to leave samples of Breo for him to pick up  He is aware of lab hours  He states that the reason that they can not scheduled his PFT sooner is bc he tested pos for covid on 03/08/20 and can not be tested again to have the PFT done until 06/06/20  His current PFT is scheduled for 06/13/20- do you want to just wait and see him after the PFT or does he need to f/u before?

## 2020-04-03 NOTE — Telephone Encounter (Signed)
Blood allergy profile strongly positive for various things Pointers are to asthma  Plan - needs cbc with diff - to check eos - forgot to order - need blood IgE - I recommended this and normally done with RAST panel but is missing from the current results  - giv him sample breo (any dose) 1 puff daily for 1 months - give him appt in march after CT to see an app (currently he is scheduled way out in may for PFT and seeing Tammy after that)  - give him appt in April to see me (pls see I you can move pft to April)

## 2020-04-04 LAB — IGE: IgE (Immunoglobulin E), Serum: 326 kU/L — ABNORMAL HIGH (ref <=114)

## 2020-04-11 ENCOUNTER — Ambulatory Visit
Admission: RE | Admit: 2020-04-11 | Discharge: 2020-04-11 | Disposition: A | Discharge status: Home / Self Care | Payer: BC Managed Care – PPO | Source: Ambulatory Visit | Attending: Internal Medicine | Admitting: Internal Medicine

## 2020-04-11 DIAGNOSIS — J180 Bronchopneumonia, unspecified organism: Secondary | ICD-10-CM | POA: Diagnosis not present

## 2020-04-11 DIAGNOSIS — J40 Bronchitis, not specified as acute or chronic: Secondary | ICD-10-CM | POA: Diagnosis not present

## 2020-04-11 DIAGNOSIS — Z8616 Personal history of COVID-19: Secondary | ICD-10-CM

## 2020-04-11 DIAGNOSIS — J984 Other disorders of lung: Secondary | ICD-10-CM | POA: Diagnosis not present

## 2020-04-11 DIAGNOSIS — J219 Acute bronchiolitis, unspecified: Secondary | ICD-10-CM | POA: Diagnosis not present

## 2020-04-11 DIAGNOSIS — Z8701 Personal history of pneumonia (recurrent): Secondary | ICD-10-CM

## 2020-04-11 DIAGNOSIS — Z8709 Personal history of other diseases of the respiratory system: Secondary | ICD-10-CM

## 2020-04-23 NOTE — Progress Notes (Unsigned)
Virtual Visit via Telephone Note  I connected with Stephen Burnett on 04/23/20 at  9:00 AM EDT by telephone and verified that I am speaking with the correct person using two identifiers.  Location: Patient: Home Provider: Office   I discussed the limitations, risks, security and privacy concerns of performing an evaluation and management service by telephone and the availability of in person appointments. I also discussed with the patient that there may be a patient responsible charge related to this service. The patient expressed understanding and agreed to proceed.   History of Present Illness: 25 year old male, current smoker. PMH significant for asthma, recurrent pneumonia. Patient of Dr. Marchelle Gearing, seen for initial consult on 03/28/20.   Previous [facility] pulmonary encounter:  HPI Stephen Burnett 88 y.o. --year-old male.  He works in different jobs.  He tells me that his job involves working in the external environment and cold weather and also exposed to different chemicals and sometimes swamp.  He tells me that in 2019 he had his first pneumonia and then after that in 2020 but then multiple episodes in 2021.  He has different chest x-rays most of them are clear but some of them show streaky opacities.  I personally visualized some of them.  His last pneumonia episode was in summer 2021.  Then in January 2022 he had COVID-19 despite being vaccinated.  He took some preventive antibiotics and prednisone.  He says that all his pneumonia episodes have been characterized by wheezing and treatment with antibiotics/prednisone and this would help.  He gives a history of bronchiolitis from RSV at age 63 months.  Since then he had asthma till age 25 and he thought he outgrew it.  However in 2021 his blood eosinophils were very high.  He is not aware that he might have asthma recently.  Nobody has told him that.  He said up until 3 or 4 years ago he was involved heavily with substance abuse but now is in  remission.   Results for KRISTAIN, HU (MRN [id number]) as of 03/28/2020 11:06  Ref. Range 07/08/2019 05:25  Eosinophils Absolute Latest Ref Range: 0.0 - 0.5 K/uL 0.6 (H)     Multiple chest x-rays -September 2020: Chest x-ray report is clear -April 2021: Chest x-ray reported as no cardiovascular abnormalities -June 2021: Chest x-ray reported as lingular abnormalities atelectasis versus pneumonia -July 2021: Chest x-ray reported as bibasilar atelectasis -August 2021: Chest x-ray reported as no acute infiltrates  SARS-CoV2 BD Positive (A)Comment: This is a corrected result. Previous result was Negative on 03/08/2020 at 1205 EST     04/24/2020 - interim hx  Patient contacted today for 1 month follow-up/review testing. Currently maintained on Breo 100. He is not using Breo d/t nausea. He had covid in January so he can not be scheduled for PFTs until late April/May. He previously was on Singulair and Advair as a child d.t asthmatic bronchitis and tolerated these medication well. He stopped them around the age of 12. He was exposure to mold at his previous residence over two years ago when he started to get recurrent pneumonia. He has since moved and reports no current issues with mold.  HRCT showed no evidence of ILD, mild air trapping indicative of small airway disease. IgE significantly elevated at 326 and eosinophil absolute 800. Multiple positive allergens on allergy panel. PFTs have not yet been done.     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 June 2022   I discussed the assessment and treatment plan with the patient. The patient was provided an opportunity to ask questions and  all were answered. The patient agreed with the plan and demonstrated an understanding of the instructions.   The patient was advised to call back or seek an in-person evaluation if the symptoms worsen or if the condition fails to improve as anticipated.  I provided 18 minutes of non-face-to-face time during this encounter.   Marchelle Gearing, NP

## 2020-04-24 ENCOUNTER — Encounter: Payer: Self-pay | Admitting: Primary Care

## 2020-04-24 ENCOUNTER — Ambulatory Visit (INDEPENDENT_AMBULATORY_CARE_PROVIDER_SITE_OTHER): Payer: BC Managed Care – PPO | Admitting: Primary Care

## 2020-04-24 ENCOUNTER — Other Ambulatory Visit: Payer: Self-pay

## 2020-04-24 DIAGNOSIS — J8283 Eosinophilic asthma: Secondary | ICD-10-CM

## 2020-04-24 MED ORDER — MONTELUKAST SODIUM 10 MG PO TABS
10.0000 mg | ORAL_TABLET | Freq: Every day | ORAL | 11 refills | Status: DC
Start: 2020-04-24 — End: 2021-07-21

## 2020-04-24 MED ORDER — FLUTICASONE-SALMETEROL 115-21 MCG/ACT IN AERO: 2.0000 | INHALATION_SPRAY | Freq: Two times a day (BID) | RESPIRATORY_TRACT | 11 refills | Status: DC

## 2020-04-24 NOTE — Patient Instructions (Signed)
-   IgE 326, Eos absolute 800, multiple positive allergens  - Start Advair HFA 115-21 mcg two puff morning and evening  - Start Singulair 10mg  at bedtime - PFTs scheduled for April/May 2022 - Refer to allergy

## 2020-04-24 NOTE — Addendum Note (Signed)
Addended by: Dulcy Fanny on: 04/24/2020 09:18 AM   Modules accepted: Orders

## 2020-06-10 ENCOUNTER — Other Ambulatory Visit (HOSPITAL_COMMUNITY)
Admission: RE | Admit: 2020-06-10 | Discharge: 2020-06-10 | Disposition: A | Discharge status: Home / Self Care | Payer: BC Managed Care – PPO | Source: Ambulatory Visit | Attending: Adult Health | Admitting: Adult Health

## 2020-06-10 DIAGNOSIS — Z20822 Contact with and (suspected) exposure to covid-19: Secondary | ICD-10-CM | POA: Diagnosis not present

## 2020-06-10 DIAGNOSIS — Z01812 Encounter for preprocedural laboratory examination: Secondary | ICD-10-CM | POA: Insufficient documentation

## 2020-06-11 LAB — SARS CORONAVIRUS 2 (TAT 6-24 HRS): SARS Coronavirus 2: NEGATIVE

## 2020-06-13 ENCOUNTER — Other Ambulatory Visit: Payer: Self-pay

## 2020-06-13 ENCOUNTER — Encounter: Payer: Self-pay | Admitting: Allergy

## 2020-06-13 ENCOUNTER — Encounter: Payer: Self-pay | Admitting: Adult Health

## 2020-06-13 ENCOUNTER — Ambulatory Visit: Payer: BC Managed Care – PPO | Admitting: Adult Health

## 2020-06-13 ENCOUNTER — Ambulatory Visit (INDEPENDENT_AMBULATORY_CARE_PROVIDER_SITE_OTHER): Payer: BC Managed Care – PPO | Admitting: Internal Medicine

## 2020-06-13 ENCOUNTER — Ambulatory Visit (INDEPENDENT_AMBULATORY_CARE_PROVIDER_SITE_OTHER): Payer: BC Managed Care – PPO | Admitting: Allergy

## 2020-06-13 ENCOUNTER — Telehealth: Payer: Self-pay | Admitting: *Deleted

## 2020-06-13 VITALS — BP 112/78 | HR 67 | Temp 98.2°F | Resp 17 | Ht 75.0 in | Wt 277.0 lb

## 2020-06-13 DIAGNOSIS — J4540 Moderate persistent asthma, uncomplicated: Secondary | ICD-10-CM

## 2020-06-13 DIAGNOSIS — Z8709 Personal history of other diseases of the respiratory system: Secondary | ICD-10-CM | POA: Diagnosis not present

## 2020-06-13 DIAGNOSIS — J189 Pneumonia, unspecified organism: Secondary | ICD-10-CM | POA: Diagnosis not present

## 2020-06-13 DIAGNOSIS — J3089 Other allergic rhinitis: Secondary | ICD-10-CM

## 2020-06-13 DIAGNOSIS — Z8616 Personal history of COVID-19: Secondary | ICD-10-CM

## 2020-06-13 DIAGNOSIS — J8283 Eosinophilic asthma: Secondary | ICD-10-CM

## 2020-06-13 DIAGNOSIS — J309 Allergic rhinitis, unspecified: Secondary | ICD-10-CM | POA: Diagnosis not present

## 2020-06-13 DIAGNOSIS — F17200 Nicotine dependence, unspecified, uncomplicated: Secondary | ICD-10-CM

## 2020-06-13 DIAGNOSIS — Z8701 Personal history of pneumonia (recurrent): Secondary | ICD-10-CM

## 2020-06-13 LAB — PULMONARY FUNCTION TEST
DL/VA % pred: 92 %
DL/VA: 4.5 ml/min/mmHg/L
DLCO cor % pred: 86 %
DLCO cor: 34.21 ml/min/mmHg
DLCO unc % pred: 86 %
DLCO unc: 34.21 ml/min/mmHg
FEF 25-75 Post: 3.61 L/sec
FEF 25-75 Pre: 3.63 L/sec
FEF2575-%Change-Post: 0 %
FEF2575-%Pred-Post: 69 %
FEF2575-%Pred-Pre: 69 %
FEV1-%Change-Post: -2 %
FEV1-%Pred-Post: 83 %
FEV1-%Pred-Pre: 85 %
FEV1-Post: 4.46 L
FEV1-Pre: 4.55 L
FEV1FVC-%Change-Post: -1 %
FEV1FVC-%Pred-Pre: 91 %
FEV6-%Change-Post: 0 %
FEV6-%Pred-Post: 92 %
FEV6-%Pred-Pre: 93 %
FEV6-Post: 6.02 L
FEV6-Pre: 6.06 L
FEV6FVC-%Pred-Post: 101 %
FEV6FVC-%Pred-Pre: 101 %
FVC-%Change-Post: 0 %
FVC-%Pred-Post: 91 %
FVC-%Pred-Pre: 92 %
FVC-Post: 6.02 L
FVC-Pre: 6.06 L
Post FEV1/FVC ratio: 74 %
Post FEV6/FVC ratio: 100 %
Pre FEV1/FVC ratio: 75 %
Pre FEV6/FVC Ratio: 100 %
RV % pred: 95 %
RV: 1.8 L
TLC % pred: 94 %
TLC: 7.65 L

## 2020-06-13 LAB — POCT EXHALED NITRIC OXIDE: FeNO level (ppb): 5

## 2020-06-13 MED ORDER — ALBUTEROL SULFATE (2.5 MG/3ML) 0.083% IN NEBU: 2.5000 mg | INHALATION_SOLUTION | Freq: Four times a day (QID) | RESPIRATORY_TRACT | 12 refills | Status: AC | PRN

## 2020-06-13 NOTE — Progress Notes (Signed)
Feno/Full PFT performed today.

## 2020-06-13 NOTE — Assessment & Plan Note (Signed)
Moderate persistent asthma improved control on Advair.  Patient has a allergic phenotype with elevated IgE and eosinophils.  Would continue on Singulair and Zyrtec.  Pulmonary function testing shows normal lung function.  Patient is encouraged on smoking cessation. Asthma action plan discussed.  Plan  Patient Instructions  Continue Advair HFA 2 puffs Twice daily , rinse after use.  Albuterol inhaler pr neb  As needed   Activity as tolerated.  Continue on Singulair and Zyrtec 10mg  daily .  Flonase nasal 2 puffs daily As needed   Asthma action plan  Main goal is to quit smoking  Follow up with Dr. in 4-6 months and As needed

## 2020-06-13 NOTE — Patient Instructions (Signed)
Feno and Full PFT performed today.

## 2020-06-13 NOTE — Addendum Note (Signed)
Addended by: Delrae Rend on: 06/13/2020 11:54 AM   Modules accepted: Orders

## 2020-06-13 NOTE — Progress Notes (Signed)
New Patient Note  RE: Marlene BastJustin Henrikson MRN: [id number] DOB: 1995-11-30 Date of Office Visit: 06/13/2020  Referring provider: Glenford BayleyWalsh, Elizabeth W, NP Primary care provider: Arvilla MarketWallace, Catherine Lauren, DO  Chief Complaint: Asthma  History of present illness: Marlene BastJustin Wrobleski is a 25 y.o. male presenting today for consultation for eosinophilic asthma.    He was diagnosed with asthma around 9118 months old.  He states the last time he remembers having an asthma attack was in the second grade. He states he doesn't have asthma exacerbations and he does not have any symptoms related to asthma as an adult.  However he was started on advair and singulair within the last month by pulmonology.  He has an albuterol inhaler and nebulizer but states has not needed this and does not recall the last time he needed to use albuterol.    He states he initially went to see Dr. Marchelle Gearingamaswamy due to history of recurrent pneumonia.  However states it took a while to get in to see them for the pneumonia history he has his first appointment in February 2022.  He states he had pneumonia 7 times in a year span in 2020-2021.  He states he would have back and neck pain and felt like he was getting stabbed in the back with an ice pick when he would have pneumonia.  He would have CXR that would show "streakiness".  He states the pneumonias would be separated by 2-3 months apart before redeveloping symptoms.  These are all treated as an outpatient with antibiotics and steroids.  He never required any hospitalization.  He did have COVID-19 in January 2022 and states he has received all vaccinations thus far.  He has not had any pneumonia in the past year he reports. Throughout the times he had his recurrent pneumonia as he states he had blood work and his blood work did reveal elevated eosinophil levels at an absolute value of around 600. He states he was advised to get the pneumonia vaccine but he has not done this yet. He had a chest  CT done after his initial pulmonology visit which showed "smoking-related respiratory bronchiolitis.  Mild peribronchial thickening.  Minimal scarring in the lingula.  Minimal air trapping."  There is no evidence of interstitial lung disease. He had a perennial allergen panel done that showed sensitivity to dust mite, cat, dog, mold. He was recommended to use Breo but states he stopped this as it was making him nauseous. His most recent pulmonology visit was this morning where he had PFTs performed that was No evidence of obstruction.  PFT: FEV1 4.55 L or 85%, FVC 6.06 L or 92% predicted.  This is a normal spirometry  He does smoke and states he has cut back now to half a pack to a pack per day.  He was smoking 2-2.50 half packs a day.  He states he is continuing to cut back.  He is using a vape device to help him cut back.  His work involves a lot of outdoor work and is around dust, fumes.  Cats make him feel itchy.   He may have itchy eyes, runny nose and sneezing if he is has been in a dusty house.  He does not normally take any medications for this as the symptoms aside once removed from the exposure.  Review of systems: Review of Systems  Constitutional: Negative.   HENT: Negative.   Eyes: Negative.   Respiratory: Negative.   Cardiovascular: Negative.   Gastrointestinal:  Negative.   Musculoskeletal: Negative.   Skin: Negative.   Neurological: Negative.     All other systems negative unless noted above in HPI  Past medical history: Past Medical History:  Diagnosis Date  . Asthma     Past surgical history: History reviewed. No pertinent surgical history.  Family history:  Family History  Problem Relation Age of Onset  . Healthy Mother   . Heart attack Father     Social history: He lives in a dwelling has not a home, condo, apartment or townhouse.  There is no carpeting in the home.  There is no concern for water damage, mildew or roaches in the home.  There are no  pets inside the home.  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  Medication List: Current Outpatient Medications  Medication Sig Dispense Refill  . albuterol (PROVENTIL) (2.5 MG/3ML) 0.083% nebulizer solution Take 3 mLs (2.5 mg total) by nebulization every 6 (six) hours as needed for wheezing or shortness of breath. 75 mL 12  . albuterol (VENTOLIN HFA) 108 (90 Base) MCG/ACT inhaler Inhale 2 puffs into the lungs every 6 (six) hours as needed for wheezing or shortness of breath. 18 g 0  . cetirizine (ZYRTEC) 10 MG tablet Take 10 mg by mouth daily.    . fluticasone-salmeterol (ADVAIR HFA) 115-21 MCG/ACT inhaler Inhale 2 puffs into the lungs 2 (two) times daily. 1 each 11  . montelukast (SINGULAIR) 10 MG tablet Take 1 tablet (10 mg total) by mouth at bedtime. 30 tablet 11   No current facility-administered medications for this visit.    Known medication allergies: Allergies  Allergen Reactions  . Banana Anaphylaxis     Physical examination: Blood pressure 112/78, pulse 67, temperature 98.2 F (36.8 C), temperature source Temporal, resp. rate 17, height 6\' 3"  (1.905 m), weight 277 lb (125.6 kg), SpO2 98 %.  General: Alert, interactive, in no acute distress. HEENT: PERRLA, TMs pearly gray, turbinates non-edematous without discharge, post-pharynx non erythematous. Neck: Supple without lymphadenopathy. Lungs: Clear to auscultation without wheezing, rhonchi or rales. {no increased work of breathing. CV: Normal S1, S2 without murmurs. Abdomen: Nondistended, nontender. Skin: Warm and dry, without lesions or rashes. Extremities:  No clubbing, cyanosis or edema. Neuro:   Grossly intact.  Diagnositics/Labs: Labs:  Component     Latest Ref Rng & Units 03/28/2020 04/03/2020  D Pteronyssinus IgE     Class III kU/L 3.31 (A)   D Farinae IgE     Class III kU/L 3.18 (A)   Cat Dander IgE     Class III kU/L 2.24 (A)   Dog Dander IgE     Class II kU/L 0.84 (A)   Cow Dander  IgE     Class 0/I kU/L 0.13 (A)   Goose Feathers IgE     Class 0 kU/L <0.10   Chicken Feathers IgE     Class 0 kU/L <0.10   Duck Feathers IgE     Class 0 kU/L <0.10   Penicillium Chrysogen IgE     Class 0 kU/L <0.10   Cladosporium Herbarum IgE     Class 0/I kU/L 0.25 (A)   Aspergillus Fumigatus IgE     Class I kU/L 0.45 (A)   Mucor Racemosus IgE     Class 0 kU/L <0.10   Candida Albicans IgE     Class III kU/L 2.04 (A)   Alternaria Alternata IgE     Class II kU/L 1.34 (A)   Setomelanomma  Rostrat     Class III kU/L 2.36 (A)   Aureobasidi Pullulans IgE     Class II kU/L 0.91 (A)   Phoma Betae IgE     Class II kU/L 1.35 (A)   Stemphylium Herbarum IgE     Class III kU/L 2.34 (A)   Mouse Urine IgE     Class IV kU/L 8.98 (A)   WBC     4.0 - 10.5 K/uL  8.4  RBC     4.22 - 5.81 Mil/uL  4.74  Hemoglobin     13.0 - 17.0 g/dL  22.4  HCT     82.5 - 00.3 %  43.9  MCV     78.0 - 100.0 fl  92.7  MCHC     30.0 - 36.0 g/dL  70.4  RDW     88.8 - 91.6 %  14.1  Platelets     150.0 - 400.0 K/uL  159.0  Neutrophils     43.0 - 77.0 %  51.3  Lymphocytes     12.0 - 46.0 %  32.2  Monocytes Relative     3.0 - 12.0 %  5.9  Eosinophil     0.0 - 5.0 %  9.4 (H)  Basophil     0.0 - 3.0 %  1.2  NEUT#     1.4 - 7.7 K/uL  4.3  Lymphocyte #     0.7 - 4.0 K/uL  2.7  Monocyte #     0.1 - 1.0 K/uL  0.5  Eosinophils Absolute     0.0 - 0.7 K/uL  0.8 (H)  Basophils Absolute     0.0 - 0.1 K/uL  0.1  IgE (Immunoglobulin E), Serum     <OR=114 kU/L  326 (H)    Spirometry: See HPI  Personally reviewed CT scan-see HPI  Most recent chest x-ray is from 10/06/2019 that appears unremarkable without evidence of consolidation or infiltrate  Assessment and plan:   Recurrent pneumonia -his recurrent pnuemonia is concerning that his immune system may not be functioning appropriately.  Will obtain immunocompetence work-up as follows: CBC w diff, immunoglobulins, vaccine titers.   It is very  possible that he does not have adequate titers to strep pnumonia strains which would make him more susceptible to respiratory tract infections.  If this indeed in the case then will have him get pnuemovax to boost this response.    Asthma, eosinophilic - at this time he reports no daytime or nighttime symptoms.  He has not required albuterol use or systemic steroid.  He does not recall the last time he had respiratory symptoms since having pneumonia last in 2021.   Thus at this time do not feel he warrants consistent use of ICS/LABA like Advair.  Of course he has a respiratory illness or exacerbation he should use at that time.  Also would recommend consistent use of ICS/LABA like Advair if he is not meeting the below goals.    He also does not meet criteria for need of an asthma biologic agent at this time.  -I have recommended that he continue use of Singulair as he does have allergic rhinitis which this can benefit -have access to albuterol inhaler 2 puffs every 4-6 hours as needed for cough/wheeze/shortness of breath/chest tightness.  May use 15-20 minutes prior to activity.   Monitor frequency of use.    Asthma control goals:   Full participation in all desired activities (may need albuterol before activity)  Albuterol use  two time or less a week on average (not counting use with activity)  Cough interfering with sleep two time or less a month  Oral steroids no more than once a year  No hospitalizations  Allergic rhinitis -perennial allergy testing via blood work is positive to dust mite, cat, dog, cow dander, molds -will obtain full environmental allergy including pollens via serum IgE -as above continue singulair -he may benefit from Zyrtec as needed use.  Pending return of allergy panel will help determine if he may benefit from year-round antihistamine -for itchy/watery eyes can use OTC Pataday 1 drop each eye daily as needed -for nasal congestion/drainage can use OTC Flonase,  Rhincort or Nasacort 2 sprays each nostril daily for 1-2 weeks at a time before stopping once nasal congestion improves for maximum benefit  Follow-up in 4-6 months or sooner if needed  I appreciate the opportunity to take part in Teddrick's care. Please do not hesitate to contact me with questions.  Sincerely,   Margo Aye, MD Allergy/Immunology [facility]

## 2020-06-13 NOTE — Patient Instructions (Signed)
Continue Advair HFA 2 puffs Twice daily , rinse after use.  Albuterol inhaler pr neb  As needed   Activity as tolerated.  Continue on Singulair and Zyrtec 10mg  daily .  Flonase nasal 2 puffs daily As needed   Asthma action plan  Main goal is to quit smoking  Follow up with Dr. in 4-6 months and As needed

## 2020-06-13 NOTE — Progress Notes (Signed)
@Patient  ID: , male    DOB: 12-12-1995, 25 y.o.   MRN: [id number]  Chief Complaint  Patient presents with  . Follow-up    Referring provider: [id number]*  HPI: 25 year old male active heavy smoker followed for asthma and recurrent pneumonia Childhood Asthma   TEST/EVENTS :  IgE significantly elevated at 326 and eosinophil absolute 800. Multiple positive allergens on allergy panel.  High-resolution CT chest May 13, 2020 shows smoking-related respiratory bronchiolitis mild peribronchial thickening .  No ILD.  06/13/2020 Follow up : Allergic Asthma  Patient presents for a 22-month follow-up.  Patient has underlying allergic asthma.  With an elevated IgE and eosinophils.  He was started on Advair last visit.  Recommend to continue on Singulair and Zyrtec.  Since last visit patient is feeling good.  Says he has no flare of his shortness of breath or wheezing.  Says he really likes the Advair is working well.  He really feels that he has no current breathing problems. Patient continues to smoke.  Smoking cessation was discussed.Has cut back on smoking was up 2.5ppd, now 1/2 ppd.  pulmonary function testing today shows normal lung function, with no airflow obstruction or restriction.  FEV1 is 83%, ratio 74, FVC 91%, no significant bronchodilator response, DLCO is 86%. Rare use of albuterol .  Very active with work. 15K steps a day .       Allergies  Allergen Reactions  . Banana Anaphylaxis    Immunization History  Administered Date(s) Administered  . PFIZER(Purple Top)SARS-COV-2 Vaccination 06/05/2019, 06/27/2019    Past Medical History:  Diagnosis Date  . Asthma     Tobacco History: Social History   Tobacco Use  Smoking Status Current Every Day Smoker  . Packs/day: 2.50  . Years: 17.00  . Pack years: 42.50  . Types: Cigarettes  Smokeless Tobacco Former 06/29/2019  Tobacco Comment   1/2 pack-pack per day   Ready to quit: No Counseling given:  Yes Comment: 1/2 pack-pack per day   Outpatient Medications Prior to Visit  Medication Sig Dispense Refill  . albuterol (VENTOLIN HFA) 108 (90 Base) MCG/ACT inhaler Inhale 2 puffs into the lungs every 6 (six) hours as needed for wheezing or shortness of breath. 18 g 0  . fluticasone-salmeterol (ADVAIR HFA) 115-21 MCG/ACT inhaler Inhale 2 puffs into the lungs 2 (two) times daily. 1 each 11  . montelukast (SINGULAIR) 10 MG tablet Take 1 tablet (10 mg total) by mouth at bedtime. 30 tablet 11  . benzonatate (TESSALON) 100 MG capsule Take 1 capsule (100 mg total) by mouth every 8 (eight) hours. (Patient not taking: Reported on 04/24/2020) 21 capsule 0  . cetirizine (ZYRTEC ALLERGY) 10 MG tablet Take 1 tablet (10 mg total) by mouth daily. (Patient not taking: No sig reported) 30 tablet 0  . fluticasone (FLONASE) 50 MCG/ACT nasal spray Place 1 spray into both nostrils daily. (Patient not taking: No sig reported) 16 g 0   No facility-administered medications prior to visit.     Review of Systems:   Constitutional:   No  weight loss, night sweats,  Fevers, chills, fatigue, or  lassitude.  HEENT:   No headaches,  Difficulty swallowing,  Tooth/dental problems, or  Sore throat,                No sneezing, itching, ear ache,  +nasal congestion, post nasal drip,   CV:  No chest pain,  Orthopnea, PND, swelling in lower extremities, anasarca, dizziness, palpitations, syncope.  GI  No heartburn, indigestion, abdominal pain, nausea, vomiting, diarrhea, change in bowel habits, loss of appetite, bloody stools.   Resp: No shortness of breath with exertion or at rest.  No excess mucus, no productive cough,  No non-productive cough,  No coughing up of blood.  No change in color of mucus.  No wheezing.  No chest wall deformity  Skin: no rash or lesions.  GU: no dysuria, change in color of urine, no urgency or frequency.  No flank pain, no hematuria   MS:  No joint pain or swelling.  No decreased range of  motion.  No back pain.    Physical Exam  BP 110/80 (BP Location: Left Arm, Patient Position: Sitting, Cuff Size: Large)   Pulse 76   Temp 98.3 F (36.8 C) (Temporal)   Ht 6\' 4"  (1.93 m)   Wt 274 lb (124.3 kg)   SpO2 98%   BMI 33.35 kg/m   GEN: A/Ox3; pleasant , NAD, well nourished    HEENT:  [place]/AT,    NOSE-clear, THROAT-clear, no lesions, no postnasal drip or exudate noted.   NECK:  Supple w/ fair ROM; no JVD; normal carotid impulses w/o bruits; no thyromegaly or nodules palpated; no lymphadenopathy.    RESP  Clear  P & A; w/o, wheezes/ rales/ or rhonchi. no accessory muscle use, no dullness to percussion  CARD:  RRR, no m/r/g, no peripheral edema, pulses intact, no cyanosis or clubbing.  GI:   Soft & nt; nml bowel sounds; no organomegaly or masses detected.   Musco: Warm bil, no deformities or joint swelling noted.   Neuro: alert, no focal deficits noted.    Skin: Warm, no lesions or rashes    Lab Results:    BNP No results found for: BNP  ProBNP No results found for: PROBNP  Imaging: No results found.    PFT Results Latest Ref Rng & Units 06/13/2020  FVC-Pre L 6.06  FVC-Predicted Pre % 92  FVC-Post L 6.02  FVC-Predicted Post % 91  Pre FEV1/FVC % % 75  Post FEV1/FCV % % 74  FEV1-Pre L 4.55  FEV1-Predicted Pre % 85  FEV1-Post L 4.46  DLCO uncorrected ml/min/mmHg 34.21  DLCO UNC% % 86  DLCO corrected ml/min/mmHg 34.21  DLCO COR %Predicted % 86  DLVA Predicted % 92  TLC L 7.65  TLC % Predicted % 94  RV % Predicted % 95    No results found for: NITRICOXIDE      Assessment & Plan:   Asthma Moderate persistent asthma improved control on Advair.  Patient has a allergic phenotype with elevated IgE and eosinophils.  Would continue on Singulair and Zyrtec.  Pulmonary function testing shows normal lung function.  Patient is encouraged on smoking cessation. Asthma action plan discussed.  Plan  Patient Instructions  Continue Advair HFA 2 puffs  Twice daily , rinse after use.  Albuterol inhaler pr neb  As needed   Activity as tolerated.  Continue on Singulair and Zyrtec 10mg  daily .  Flonase nasal 2 puffs daily As needed   Asthma action plan  Main goal is to quit smoking  Follow up with Dr. 08/13/2020 in 4-6 months and As needed        Allergic rhinitis Continue on Singulair and Zyrtec daily.  Flonase as needed  Smoking Smoking cessation encouraged     , NP 06/13/2020

## 2020-06-13 NOTE — Assessment & Plan Note (Signed)
Smoking cessation encouraged!

## 2020-06-13 NOTE — Addendum Note (Signed)
Addended by: Janean Sark on: 06/13/2020 11:40 AM   Modules accepted: Orders

## 2020-06-13 NOTE — Assessment & Plan Note (Signed)
Continue on Singulair and Zyrtec daily.  Flonase as needed

## 2020-06-13 NOTE — Telephone Encounter (Signed)
Called [facility] pharmacy in [place] and spoke with Cletis Athens regarding the Advair prescription.  I was advised that the patient picked up the inhaler on 04/24/20, the refill was filled, but he never came in to pick it up.  The prescription was put back.  He will need to request the refill and pick it up.  Called cell #[phone], no answer, the vm was full, unable to leave a message.

## 2020-06-13 NOTE — Patient Instructions (Addendum)
Recurrent pneumonia -his recurrent pnuemonia is concerning that his immune system may not be functioning appropriately.  Will obtain immunocompetence work-up as follows: CBC w diff, immunoglobulins, vaccine titers.   It is very possible that he does not have adequate titers to strep pnumonia strains which would make him more susceptible to respiratory tract infections.  If this indeed in the case then will have him get pnuemovax to boost this response.    Asthma, eosinophilic - at this time he reports no daytime or nighttime symptoms.  He has not required albuterol use or systemic steroid.  He does not recall the last time he had respiratory symptoms since having pneumonia last in 2021.   Thus at this time do not feel he warrants consistent use of ICS/LABA like Advair.  Of course he has a respiratory illness or exacerbation he should use at that time.  Also would recommend consistent use of ICS/LABA like Advair if he is not meeting the below goals.    He also does not meet criteria for need of an asthma biologic agent at this time.  -I have recommended that he continue use of Singulair as he does have allergic rhinitis which this can benefit -have access to albuterol inhaler 2 puffs every 4-6 hours as needed for cough/wheeze/shortness of breath/chest tightness.  May use 15-20 minutes prior to activity.   Monitor frequency of use.    Asthma control goals:   Full participation in all desired activities (may need albuterol before activity)  Albuterol use two time or less a week on average (not counting use with activity)  Cough interfering with sleep two time or less a month  Oral steroids no more than once a year  No hospitalizations    Allergic rhinitis -perennial allergy testing via blood work is positive to dust mite, cat, dog, cow dander, molds -will obtain full environmental allergy including pollens via serum IgE -as above continue singulair -he may benefit from Zyrtec as needed use.   Pending return of allergy panel will help determine if he may benefit from year-round antihistamine -for itchy/watery eyes can use OTC Pataday 1 drop each eye daily as needed -for nasal congestion/drainage can use OTC Flonase, Rhincort or Nasacort 2 sprays each nostril daily for 1-2 weeks at a time before stopping once nasal congestion improves for maximum benefit  Follow-up in 4-6 months or sooner if needed

## 2020-06-21 LAB — ALLERGENS W/TOTAL IGE AREA 2
Alternaria Alternata IgE: 0.79 kU/L — AB
Aspergillus Fumigatus IgE: 0.3 kU/L — AB
Bermuda Grass IgE: 3.89 kU/L — AB
Cat Dander IgE: 2.17 kU/L — AB
Cedar, Mountain IgE: 0.3 kU/L — AB
Cladosporium Herbarum IgE: 0.23 kU/L — AB
Cockroach, German IgE: 0.55 kU/L — AB
Common Silver Birch IgE: <0.1 kU/L
Cottonwood IgE: 0.17 kU/L — AB
D Farinae IgE: 4.54 kU/L — AB
D Pteronyssinus IgE: 3.47 kU/L — AB
Dog Dander IgE: 0.43 kU/L — AB
Elm, American IgE: 1.8 kU/L — AB
Johnson Grass IgE: 5.1 kU/L — AB
Maple/Box Elder IgE: 3.35 kU/L — AB
Mouse Urine IgE: 7.18 kU/L — AB
Oak, White IgE: 0.16 kU/L — AB
Pecan, Hickory IgE: <0.1 kU/L
Penicillium Chrysogen IgE: <0.1 kU/L
Pigweed, Rough IgE: 0.94 kU/L — AB
Ragweed, Short IgE: 0.41 kU/L — AB
Sheep Sorrel IgE Qn: 0.23 kU/L — AB
Timothy Grass IgE: 7.38 kU/L — AB
White Mulberry IgE: 0.55 kU/L — AB

## 2020-06-21 LAB — STREP PNEUMONIAE 23 SEROTYPES IGG
Pneumo Ab Type 1*: 10.9 ug/mL (ref >1.3)
Pneumo Ab Type 12 (12F)*: 0.3 ug/mL — ABNORMAL LOW (ref >1.3)
Pneumo Ab Type 14*: 0.6 ug/mL — ABNORMAL LOW (ref >1.3)
Pneumo Ab Type 17 (17F)*: 0.8 ug/mL — ABNORMAL LOW (ref >1.3)
Pneumo Ab Type 19 (19F)*: 2.4 ug/mL (ref >1.3)
Pneumo Ab Type 2*: 6 ug/mL (ref >1.3)
Pneumo Ab Type 20*: 3.6 ug/mL (ref >1.3)
Pneumo Ab Type 22 (22F)*: 0.3 ug/mL — ABNORMAL LOW (ref >1.3)
Pneumo Ab Type 23 (23F)*: <0.1 ug/mL — ABNORMAL LOW (ref >1.3)
Pneumo Ab Type 26 (6B)*: 0.3 ug/mL — ABNORMAL LOW (ref >1.3)
Pneumo Ab Type 3*: 1.8 ug/mL (ref >1.3)
Pneumo Ab Type 34 (10A)*: 0.6 ug/mL — ABNORMAL LOW (ref >1.3)
Pneumo Ab Type 4*: 0.2 ug/mL — ABNORMAL LOW (ref >1.3)
Pneumo Ab Type 43 (11A)*: 1.4 ug/mL (ref >1.3)
Pneumo Ab Type 5*: 2 ug/mL (ref >1.3)
Pneumo Ab Type 51 (7F)*: 1.1 ug/mL — ABNORMAL LOW (ref >1.3)
Pneumo Ab Type 54 (15B)*: 1 ug/mL — ABNORMAL LOW (ref >1.3)
Pneumo Ab Type 56 (18C)*: 0.5 ug/mL — ABNORMAL LOW (ref >1.3)
Pneumo Ab Type 57 (19A)*: 1.6 ug/mL (ref >1.3)
Pneumo Ab Type 68 (9V)*: 0.8 ug/mL — ABNORMAL LOW (ref >1.3)
Pneumo Ab Type 70 (33F)*: 0.8 ug/mL — ABNORMAL LOW (ref >1.3)
Pneumo Ab Type 8*: 0.8 ug/mL — ABNORMAL LOW (ref >1.3)
Pneumo Ab Type 9 (9N)*: 0.6 ug/mL — ABNORMAL LOW (ref >1.3)

## 2020-06-21 LAB — CBC WITH DIFFERENTIAL
Basophils Absolute: 0.1 10*3/uL (ref 0.0–0.2)
Basos: 2 %
EOS (ABSOLUTE): 0.6 10*3/uL — ABNORMAL HIGH (ref 0.0–0.4)
Eos: 8 %
Hematocrit: 45.4 % (ref 37.5–51.0)
Hemoglobin: 15.6 g/dL (ref 13.0–17.7)
Immature Grans (Abs): 0 10*3/uL (ref 0.0–0.1)
Immature Granulocytes: 0 %
Lymphocytes Absolute: 2.6 10*3/uL (ref 0.7–3.1)
Lymphs: 33 %
MCH: 31.8 pg (ref 26.6–33.0)
MCHC: 34.4 g/dL (ref 31.5–35.7)
MCV: 93 fL (ref 79–97)
Monocytes Absolute: 0.6 10*3/uL (ref 0.1–0.9)
Monocytes: 8 %
Neutrophils Absolute: 3.8 10*3/uL (ref 1.4–7.0)
Neutrophils: 49 %
RBC: 4.91 x10E6/uL (ref 4.14–5.80)
RDW: 12.7 % (ref 11.6–15.4)
WBC: 7.7 10*3/uL (ref 3.4–10.8)

## 2020-06-21 LAB — COMPLEMENT, TOTAL: Compl, Total (CH50): 24 U/mL — ABNORMAL LOW (ref >41)

## 2020-06-21 LAB — IMMUNOGLOBULINS A/E/G/M, SERUM
IgA/Immunoglobulin A, Serum: 258 mg/dL (ref 90–386)
IgE (Immunoglobulin E), Serum: 457 IU/mL (ref 6–495)
IgG (Immunoglobin G), Serum: 1553 mg/dL (ref 603–1613)
IgM (Immunoglobulin M), Srm: 140 mg/dL (ref 20–172)

## 2020-06-21 LAB — DIPHTHERIA / TETANUS ANTIBODY PANEL
Diphtheria Ab: 1.41 IU/mL (ref <0.10)
Tetanus Ab, IgG: 6.73 IU/mL (ref <0.10)

## 2020-07-01 ENCOUNTER — Ambulatory Visit
Admission: EM | Admit: 2020-07-01 | Discharge: 2020-07-01 | Disposition: A | Discharge status: Home / Self Care | Payer: BC Managed Care – PPO | Attending: Emergency Medicine | Admitting: Emergency Medicine

## 2020-07-01 ENCOUNTER — Ambulatory Visit (INDEPENDENT_AMBULATORY_CARE_PROVIDER_SITE_OTHER): Payer: BC Managed Care – PPO

## 2020-07-01 ENCOUNTER — Other Ambulatory Visit: Payer: Self-pay

## 2020-07-01 DIAGNOSIS — S61432A Puncture wound without foreign body of left hand, initial encounter: Secondary | ICD-10-CM

## 2020-07-01 DIAGNOSIS — S6992XA Unspecified injury of left wrist, hand and finger(s), initial encounter: Secondary | ICD-10-CM | POA: Diagnosis not present

## 2020-07-01 DIAGNOSIS — T148XXA Other injury of unspecified body region, initial encounter: Secondary | ICD-10-CM

## 2020-07-01 MED ORDER — TETANUS-DIPHTH-ACELL PERTUSSIS 5-2.5-18.5 LF-MCG/0.5 IM SUSY
0.5000 mL | PREFILLED_SYRINGE | Freq: Once | INTRAMUSCULAR | Status: AC
  Administered 2020-07-01: 0.5 mL via INTRAMUSCULAR

## 2020-07-01 MED ORDER — CEPHALEXIN 500 MG PO CAPS: 500.0000 mg | ORAL_CAPSULE | Freq: Four times a day (QID) | ORAL | 0 refills | Status: AC

## 2020-07-01 NOTE — ED Provider Notes (Signed)
[name]  ____________________________________________  Time seen: Approximately 6:50 PM  I have reviewed the triage vital signs and the nursing notes.   HISTORY  Chief Complaint Hand Injury   Historian Patient     HPI Stephen Burnett is a 25 y.o. male presents to the urgent care with a puncture wound of the left hand below the third digit.  Patient states that he has had difficulty performing flexion at the third and fourth digit since injury occurred.  Has numbness or tingling in the left hand.  He states that puncture wound was sustained using a screwdriver and occurred accidentally.  Patient irrigated wound prior to presenting to the urgent care.   Past Medical History:  Diagnosis Date  . Asthma      Immunizations up to date:  Yes.     Past Medical History:  Diagnosis Date  . Asthma     Patient Active Problem List   Diagnosis Date Noted  . Allergic rhinitis 06/13/2020  . Smoking 06/13/2020  . Asthma 06/22/2019    History reviewed. No pertinent surgical history.  Prior to Admission medications   Medication Sig Start Date End Date Taking? Authorizing Provider  cephALEXin (KEFLEX) 500 MG capsule Take 1 capsule (500 mg total) by mouth 4 (four) times daily for 7 days. 07/01/20 07/08/20 Yes Pia Mau M, PA-C  albuterol (PROVENTIL) (2.5 MG/3ML) 0.083% nebulizer solution Take 3 mLs (2.5 mg total) by nebulization every 6 (six) hours as needed for wheezing or shortness of breath. 06/13/20   Parrett, Virgel Bouquet, NP  albuterol (VENTOLIN HFA) 108 (90 Base) MCG/ACT inhaler Inhale 2 puffs into the lungs every 6 (six) hours as needed for wheezing or shortness of breath. 08/08/19   Hall-Potvin, [place], PA-C  cetirizine (ZYRTEC) 10 MG tablet Take 10 mg by mouth daily.    [provider]  fluticasone-salmeterol (ADVAIR HFA) 115-21 MCG/ACT inhaler Inhale 2 puffs into the lungs 2 (two) times daily. 04/24/20   Glenford Bayley, NP  montelukast (SINGULAIR)  10 MG tablet Take 1 tablet (10 mg total) by mouth at bedtime. 04/24/20   Glenford Bayley, NP    Allergies Banana  Family History  Problem Relation Age of Onset  . Healthy Mother   . Heart attack Father     Social History Social History   Tobacco Use  . Smoking status: Current Every Day Smoker    Packs/day: 1.00    Years: 17.00    Pack years: 17.00    Types: Cigarettes  . Smokeless tobacco: Current User    Types: Snuff  . Tobacco comment: 1/2 pack-pack per day  Vaping Use  . Vaping Use: Never used  Substance Use Topics  . Alcohol use: Not Currently    Comment: rarely   . Drug use: Not Currently     Review of Systems  Constitutional: No fever/chills Eyes:  No discharge ENT: No upper respiratory complaints. Respiratory: no cough. No SOB/ use of accessory muscles to breath Gastrointestinal:   No nausea, no vomiting.  No diarrhea.  No constipation. Musculoskeletal: Patient has left hand pain.  Skin: Negative for rash, abrasions, lacerations, ecchymosis.  ____________________________________________   PHYSICAL EXAM:  VITAL SIGNS: ED Triage Vitals [07/01/20 1838]  Enc Vitals Group     BP 127/78     Pulse Rate 64     Resp 18     Temp 98.2 F (36.8 C)     Temp Source Oral     SpO2 97 %  Weight      Height      Head Circumference      Peak Flow      Pain Score 8     Pain Loc      Pain Edu?      Excl. in [place]?      Constitutional: Alert and oriented. Well appearing and in no acute distress. Eyes: Conjunctivae are normal. PERRL. EOMI. Head: Atraumatic. ENT:      Nose: No congestion/rhinnorhea.      Mouth/Throat: Mucous membranes are moist.  Neck: No stridor.  No cervical spine tenderness to palpation. Cardiovascular: Normal rate, regular rhythm. Normal S1 and S2.  Good peripheral circulation. Respiratory: Normal respiratory effort without tachypnea or retractions. Lungs CTAB. Good air entry to the bases with no decreased or absent breath  sounds Gastrointestinal: Bowel sounds x 4 quadrants. Soft and nontender to palpation. No guarding or rigidity. No distention. Musculoskeletal: Patient has difficulty performing flexion at the left third and fourth digits.  Palpable radial and ulnar pulses bilaterally and symmetrically.  Capillary refill less than 2 seconds, left. Neurologic:  Normal for age. No gross focal neurologic deficits are appreciated.  Skin: Patient has puncture wound left palm. Psychiatric: Mood and affect are normal for age. Speech and behavior are normal.   ____________________________________________   LABS (all labs ordered are listed, but only abnormal results are displayed)  Labs Reviewed - No data to display ____________________________________________  EKG   ____________________________________________  RADIOLOGY Geraldo Pitter, personally viewed and evaluated these images (plain radiographs) as part of my medical decision making, as well as reviewing the written report by the radiologist.      No results found.  ____________________________________________    PROCEDURES  Procedure(s) performed:     Procedures     Medications  Tdap (BOOSTRIX) injection 0.5 mL (0.5 mLs Intramuscular Given 07/01/20 1845)     ____________________________________________   INITIAL IMPRESSION / ASSESSMENT AND PLAN / ED COURSE  Pertinent labs & imaging results that were available during my care of the patient were reviewed by me and considered in my medical decision making (see chart for details).      Assessment and plan:  Hand pain:  25 year old male presents to the urgent care with a puncture wound to the left palm.  Wound was copiously irrigated and cleansed with chlorhexidine.  No bony abnormalities were visualized on x-ray.  Patient's left third and fourth digits were splinted into extension and patient was started on Keflex.  Patient was advised to follow-up with his PCP if pain  persists.  All patient questions were answered.   ____________________________________________  FINAL CLINICAL IMPRESSION(S) / ED DIAGNOSES  Final diagnoses:  Puncture wound      NEW MEDICATIONS STARTED DURING THIS VISIT:  ED Discharge Orders         Ordered    cephALEXin (KEFLEX) 500 MG capsule  4 times daily        07/01/20 1910              This chart was dictated using voice recognition software/Dragon. Despite best efforts to proofread, errors can occur which can change the meaning. Any change was purely unintentional.     Orvil Feil, PA-C 07/01/20 1913

## 2020-07-01 NOTE — ED Triage Notes (Signed)
Pt states had a screw driver go through lt upper hand below middle finger. States unable to bend lt middle and index finger. States he felt it touch the bone.

## 2020-07-01 NOTE — Discharge Instructions (Addendum)
Take Keflex four times daily for the next seven days.  

## 2020-07-12 ENCOUNTER — Ambulatory Visit: Payer: BC Managed Care – PPO

## 2020-11-28 ENCOUNTER — Ambulatory Visit: Payer: BC Managed Care – PPO | Admitting: Allergy

## 2020-11-28 DIAGNOSIS — J309 Allergic rhinitis, unspecified: Secondary | ICD-10-CM

## 2021-06-10 ENCOUNTER — Encounter: Payer: Self-pay | Admitting: Emergency Medicine

## 2021-06-10 ENCOUNTER — Emergency Department: Payer: BC Managed Care – PPO

## 2021-06-10 ENCOUNTER — Emergency Department
Admission: EM | Admit: 2021-06-10 | Discharge: 2021-06-10 | Disposition: A | Discharge status: Home / Self Care | Payer: BC Managed Care – PPO | Attending: Emergency Medicine | Admitting: Emergency Medicine

## 2021-06-10 DIAGNOSIS — R404 Transient alteration of awareness: Secondary | ICD-10-CM | POA: Insufficient documentation

## 2021-06-10 DIAGNOSIS — R0602 Shortness of breath: Secondary | ICD-10-CM | POA: Insufficient documentation

## 2021-06-10 DIAGNOSIS — R079 Chest pain, unspecified: Secondary | ICD-10-CM | POA: Insufficient documentation

## 2021-06-10 LAB — CBC
HCT: 43.1 % (ref 39.0–52.0)
Hemoglobin: 14.8 g/dL (ref 13.0–17.0)
MCH: 31.3 pg (ref 26.0–34.0)
MCHC: 34.3 g/dL (ref 30.0–36.0)
MCV: 91.1 fL (ref 80.0–100.0)
Platelets: 175 10*3/uL (ref 150–400)
RBC: 4.73 MIL/uL (ref 4.22–5.81)
RDW: 12.3 % (ref 11.5–15.5)
WBC: 7.6 10*3/uL (ref 4.0–10.5)
nRBC: 0 % (ref 0.0–0.2)

## 2021-06-10 LAB — BASIC METABOLIC PANEL
Anion gap: 8 (ref 5–15)
BUN: 15 mg/dL (ref 6–20)
CO2: 26 mmol/L (ref 22–32)
Calcium: 9.3 mg/dL (ref 8.9–10.3)
Chloride: 105 mmol/L (ref 98–111)
Creatinine, Ser: 1.22 mg/dL (ref 0.61–1.24)
GFR, Estimated: >60 mL/min (ref >60)
Glucose, Bld: 107 mg/dL — ABNORMAL HIGH (ref 70–99)
Potassium: 3.9 mmol/L (ref 3.5–5.1)
Sodium: 139 mmol/L (ref 135–145)

## 2021-06-10 LAB — TROPONIN I (HIGH SENSITIVITY): Troponin I (High Sensitivity): 5 ng/L (ref <18)

## 2021-06-10 NOTE — ED Triage Notes (Signed)
Pt presents via POV with complaints of SOB & midsternal CP. Pain is exacerbated when taking deep breaths. Hx of asthma - has been out of his inhaler for the last 3-4 months.  ?

## 2021-06-10 NOTE — ED Provider Notes (Signed)
? ?[facility] ?Provider Note ? ? ? Event Date/Time  ? First MD Initiated Contact with Patient 06/10/21 [phone]   ?  (approximate) ? ? ?History  ? ?Shortness of Breath and Chest Pain ? ? ?HPI ? ?Stephen Burnett is a 26 y.o. male who reports no contributory past medical history and presents for evaluation of an episode where he was some shortness of breath, chest pain, and feeling "out of it".  His wife said that he acted like he was having a panic attack which has not happened in the past.  He felt normal before he went to bed.  He is feeling mostly back to normal at this time but said that it freaked him out.  He does not feel short of breath any longer and having chest pain. ? ?He has not had any recent injury.  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  However he does admit to last night vaping some liquid and using some sort of CBD product.  However he said he has used these products in the past. ? ?The onset was acute and nothing in particular made it better or worse but he feels better now. ?  ? ? ?Physical Exam  ? ?Triage Vital Signs: ?ED Triage Vitals  ?Enc Vitals Group  ?   BP 06/10/21 0232 (!) 150/72  ?   Pulse Rate 06/10/21 0232 62  ?   Resp 06/10/21 0232 20  ?   Temp 06/10/21 0232 98.1 ?F (36.7 ?C)  ?   Temp Source 06/10/21 0232 Oral  ?   SpO2 06/10/21 0232 98 %  ?   Weight 06/10/21 0228 131.5 kg (290 lb)  ?   Height 06/10/21 0228 1.905 m (6\' 3" )  ?   Head Circumference --   ?   Peak Flow --   ?   Pain Score 06/10/21 0228 6  ?   Pain Loc --   ?   Pain Edu? --   ?   Excl. in [place]? --   ? ? ?Most recent vital signs: ?Vitals:  ? 06/10/21 0424 06/10/21 0500  ?BP: 121/64 113/62  ?Pulse: (!) 58   ?Resp: 16   ?Temp:    ?SpO2: 96% 96%  ? ? ? ?General: Awake, no distress.  Pupils are equal and reactive. ?CV:  Good peripheral perfusion.  Normal heart sounds. ?Resp:  Normal effort.  Lungs are clear to auscultation bilaterally. ?Abd:  No  distention.  ?Psych:  Patient is calm and cooperative, giving me a concise history, denies drug use. ? ?ED Results / Procedures / Treatments  ? ?Labs ?(all labs ordered are listed, but only abnormal results are displayed) ?Labs Reviewed  ?BASIC METABOLIC PANEL - Abnormal; Notable for the following components:  ?    Result Value  ? Glucose, Bld 107 (*)   ? All other components within normal limits  ?CBC  ?TROPONIN I (HIGH SENSITIVITY)  ? ? ? ?EKG ? ?ED ECG REPORT ?IHinda Kehr, the attending physician, personally viewed and interpreted this ECG. ? ?Date: 06/10/2021 ?EKG Time: 2:30 AM ?Rate: 64 ?Rhythm: normal sinus rhythm ?QRS Axis: normal ?Intervals: normal ?ST/T Wave abnormalities: normal ?Narrative Interpretation: no evidence of acute ischemia ? ? ? ?RADIOLOGY ?I personally reviewed the patient's two-view chest x-ray and see no evidence of acute abnormality such as pneumomediastinum, pneumothorax, or infection.  The radiologist agrees that there is no evidence  of acute abnormality. ? ? ? ?PROCEDURES: ? ?Critical Care performed: No ? ?Procedures ? ? ?MEDICATIONS ORDERED IN ED: ?Medications - No data to display ? ? ?IMPRESSION / MDM / ASSESSMENT AND PLAN / ED COURSE  ?I reviewed the triage vital signs and the nursing notes. ?             ?               ? ?Differential diagnosis includes, but is not limited to, panic attack, medication or drug side effect, pneumonia, pneumomediastinum, exacerbation. ? ?Patient is calm and cooperative, in no acute distress, normal and stable vital signs.  As documented above, I reviewed his chest x-ray and it looks normal.  EKG shows no sign of ischemia nor interval abnormality.  Labs ordered initially include basic metabolic panel, CBC, and high-sensitivity troponin.  I reviewed all of these results and they are all within normal limits. ? ?He is very low risk for ACS so I canceled the second troponin.  He is PERC negative. ? ?I talked with him at length about his symptoms and he  agreed that this may be a side effect of the vape liquid and/or CBD product versus panic attack.  There is no sign of an acute or emergent medical condition at this time and he does not require hospitalization.  He and his wife are comfortable with the plan for discharge and outpatient follow-up. ? ? I gave my usual and customary return precautions. ? ?  ? ? ?FINAL CLINICAL IMPRESSION(S) / ED DIAGNOSES  ? ?Final diagnoses:  ?Shortness of breath  ?Transient alteration of awareness  ? ? ? ?Rx / DC Orders  ? ?ED Discharge Orders   ? ? None  ? ?  ? ? ? ?Note:  This document was prepared using Dragon voice recognition software and may include unintentional dictation errors. ?  ?Hinda Kehr, MD ?06/10/21 [phone] ? ?

## 2021-06-10 NOTE — Discharge Instructions (Signed)
Your workup in the Emergency Department today was reassuring.  We did not find any specific abnormalities.  We recommend you drink plenty of fluids, take your regular medications and/or any new ones prescribed today, and follow up with the doctor(s) listed in these documents as recommended.  Return to the Emergency Department if you develop new or worsening symptoms that concern you.  

## 2021-07-16 ENCOUNTER — Emergency Department (HOSPITAL_COMMUNITY)
Admission: EM | Admit: 2021-07-16 | Discharge: 2021-07-16 | Disposition: A | Discharge status: Home / Self Care | Payer: Self-pay | Attending: Emergency Medicine | Admitting: Emergency Medicine

## 2021-07-16 ENCOUNTER — Encounter (HOSPITAL_COMMUNITY): Payer: Self-pay

## 2021-07-16 ENCOUNTER — Other Ambulatory Visit: Payer: Self-pay

## 2021-07-16 ENCOUNTER — Emergency Department (HOSPITAL_COMMUNITY): Payer: Self-pay

## 2021-07-16 DIAGNOSIS — R61 Generalized hyperhidrosis: Secondary | ICD-10-CM | POA: Insufficient documentation

## 2021-07-16 DIAGNOSIS — R202 Paresthesia of skin: Secondary | ICD-10-CM | POA: Insufficient documentation

## 2021-07-16 DIAGNOSIS — R0789 Other chest pain: Secondary | ICD-10-CM | POA: Insufficient documentation

## 2021-07-16 DIAGNOSIS — R002 Palpitations: Secondary | ICD-10-CM

## 2021-07-16 DIAGNOSIS — F419 Anxiety disorder, unspecified: Secondary | ICD-10-CM | POA: Insufficient documentation

## 2021-07-16 DIAGNOSIS — R0602 Shortness of breath: Secondary | ICD-10-CM | POA: Insufficient documentation

## 2021-07-16 DIAGNOSIS — R064 Hyperventilation: Secondary | ICD-10-CM | POA: Insufficient documentation

## 2021-07-16 DIAGNOSIS — Z79899 Other long term (current) drug therapy: Secondary | ICD-10-CM | POA: Insufficient documentation

## 2021-07-16 LAB — BASIC METABOLIC PANEL
Anion gap: 5 (ref 5–15)
BUN: 15 mg/dL (ref 6–20)
CO2: 24 mmol/L (ref 22–32)
Calcium: 9.8 mg/dL (ref 8.9–10.3)
Chloride: 111 mmol/L (ref 98–111)
Creatinine, Ser: 0.94 mg/dL (ref 0.61–1.24)
GFR, Estimated: >60 mL/min (ref >60)
Glucose, Bld: 99 mg/dL (ref 70–99)
Potassium: 4 mmol/L (ref 3.5–5.1)
Sodium: 140 mmol/L (ref 135–145)

## 2021-07-16 LAB — CBC
HCT: 44 % (ref 39.0–52.0)
Hemoglobin: 15.6 g/dL (ref 13.0–17.0)
MCH: 32.3 pg (ref 26.0–34.0)
MCHC: 35.5 g/dL (ref 30.0–36.0)
MCV: 91.1 fL (ref 80.0–100.0)
Platelets: 153 10*3/uL (ref 150–400)
RBC: 4.83 MIL/uL (ref 4.22–5.81)
RDW: 12.2 % (ref 11.5–15.5)
WBC: 7.8 10*3/uL (ref 4.0–10.5)
nRBC: 0 % (ref 0.0–0.2)

## 2021-07-16 LAB — TROPONIN I (HIGH SENSITIVITY)
Troponin I (High Sensitivity): 4 ng/L (ref <18)
Troponin I (High Sensitivity): 3 ng/L (ref <18)

## 2021-07-16 LAB — RAPID URINE DRUG SCREEN, HOSP PERFORMED
Amphetamines: NOT DETECTED
Barbiturates: NOT DETECTED
Benzodiazepines: NOT DETECTED
Cocaine: NOT DETECTED
Opiates: NOT DETECTED
Tetrahydrocannabinol: POSITIVE — AB

## 2021-07-16 LAB — D-DIMER, QUANTITATIVE: D-Dimer, Quant: 0.33 ug/mL-FEU (ref 0.00–0.50)

## 2021-07-16 LAB — TSH: TSH: 4.77 u[IU]/mL — ABNORMAL HIGH (ref 0.350–4.500)

## 2021-07-16 LAB — MAGNESIUM: Magnesium: 2 mg/dL (ref 1.7–2.4)

## 2021-07-16 MED ORDER — LORAZEPAM 1 MG PO TABS
1.0000 mg | ORAL_TABLET | Freq: Once | ORAL | Status: AC
  Administered 2021-07-16: 1 mg via ORAL
  Filled 2021-07-16: qty 1

## 2021-07-16 MED ORDER — HYDROXYZINE HCL 25 MG PO TABS
25.0000 mg | ORAL_TABLET | Freq: Once | ORAL | Status: AC
  Administered 2021-07-16: 25 mg via ORAL
  Filled 2021-07-16: qty 1

## 2021-07-16 MED ORDER — HYDROXYZINE HCL 25 MG PO TABS: 25.0000 mg | ORAL_TABLET | Freq: Four times a day (QID) | ORAL | 0 refills | Status: DC | PRN

## 2021-07-16 MED ORDER — ONDANSETRON 4 MG PO TBDP
4.0000 mg | ORAL_TABLET | Freq: Once | ORAL | Status: AC
  Administered 2021-07-16: 4 mg via ORAL
  Filled 2021-07-16 (×2): qty 1

## 2021-07-16 NOTE — ED Provider Notes (Signed)
[facility]-EMERGENCY DEPT Provider Note   CSN: [id number] Arrival date & time: 07/16/21  0947     History  Chief Complaint  Patient presents with   Chest Pain   Tingling    Stephen Burnett is a 26 y.o. male.  Patient here with concern for "panic attack".  States he woke up from sleep early this morning with left-sided chest pain, shortness of breath, sweating, nauseous hyperventilation.  Symptoms lasted about 90 minutes.  After this patient had tingling and numbness involving both of his arms and both of his legs that lasted for several more hours.  He was seen at the urgent care center and sent to the ED.  Patient reports similar episode in May where he was seen at an outside hospital and diagnosed with a panic disorder.  He has had other episodes since then only at night but less severe than today's.  His chest pain or shortness of breath currently resolved.  His numbness is also resolved.  No weakness in his arms or legs.  Difficulty speaking or difficulty swallowing.  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  His father did have a heart attack at age 28 before passing away at age 28.  Patient himself has never had a stress test. He denies any regular medication use or illicit drug use currently.  He does not have a primary care doctor. He reports a tightness in his chest with shortness of breath and tingling lasted about 90 minutes and who is his most severe episode yet.  He feels back to baseline now  The history is provided by the patient and the spouse.  Chest Pain Associated symptoms: diaphoresis, numbness and shortness of breath   Associated symptoms: no abdominal pain, no dizziness, no fever, no headache, no nausea and no vomiting       Home Medications Prior to Admission medications   Medication Sig Start Date End Date Taking? Authorizing Provider  albuterol (PROVENTIL) (2.5  MG/3ML) 0.083% nebulizer solution Take 3 mLs (2.5 mg total) by nebulization every 6 (six) hours as needed for wheezing or shortness of breath. 06/13/20   Parrett, Virgel Bouquetammy S, NP  albuterol (VENTOLIN HFA) 108 (90 Base) MCG/ACT inhaler Inhale 2 puffs into the lungs every 6 (six) hours as needed for wheezing or shortness of breath. 08/08/19   Hall-Potvin, [place], PA-C  cetirizine (ZYRTEC) 10 MG tablet Take 10 mg by mouth daily.    [provider]  fluticasone-salmeterol (ADVAIR HFA) 115-21 MCG/ACT inhaler Inhale 2 puffs into the lungs 2 (two) times daily. 04/24/20   Glenford BayleyWalsh, Elizabeth W, NP  montelukast (SINGULAIR) 10 MG tablet Take 1 tablet (10 mg total) by mouth at bedtime. 04/24/20   Glenford BayleyWalsh, Elizabeth W, NP      Allergies    Banana    Review of Systems   Review of Systems  Constitutional:  Positive for diaphoresis. Negative for activity change, appetite change and fever.  HENT:  Negative for congestion and rhinorrhea.   Respiratory:  Positive for chest tightness and shortness of breath.   Cardiovascular:  Positive for chest pain.  Gastrointestinal:  Negative for abdominal pain, anal bleeding, nausea and vomiting.  Genitourinary:  Negative for dysuria and hematuria.  Musculoskeletal:  Negative for arthralgias and myalgias.  Skin:  Negative for rash.  Neurological:  Positive for numbness. Negative for dizziness and headaches.   all other systems are  negative except as noted in the HPI and PMH.   Physical Exam Updated Vital Signs BP (!) 146/80   Pulse (!) 56   Temp 97.8 F (36.6 C) (Oral)   Resp 18   SpO2 97%  Physical Exam Vitals and nursing note reviewed.  Constitutional:      General: He is not in acute distress.    Appearance: He is well-developed.  HENT:     Head: Normocephalic and atraumatic.     Mouth/Throat:     Pharynx: No oropharyngeal exudate.  Eyes:     Conjunctiva/sclera: Conjunctivae normal.     Pupils: Pupils are equal, round, and reactive to light.  Neck:      Comments: No meningismus. Cardiovascular:     Rate and Rhythm: Normal rate and regular rhythm.     Heart sounds: Normal heart sounds. No murmur heard. Pulmonary:     Effort: Pulmonary effort is normal. No respiratory distress.     Breath sounds: Normal breath sounds.  Abdominal:     Palpations: Abdomen is soft.     Tenderness: There is no abdominal tenderness. There is no guarding or rebound.  Musculoskeletal:        General: No tenderness. Normal range of motion.     Cervical back: Normal range of motion and neck supple.  Skin:    General: Skin is warm.  Neurological:     Mental Status: He is alert and oriented to person, place, and time.     Cranial Nerves: No cranial nerve deficit.     Motor: No abnormal muscle tone.     Coordination: Coordination normal.     Comments: CN 2-12 intact, no ataxia on finger to nose, no nystagmus, 5/5 strength throughout, no pronator drift, Romberg negative, normal gait.   Psychiatric:        Behavior: Behavior normal.    ED Results / Procedures / Treatments   Labs (all labs ordered are listed, but only abnormal results are displayed) Labs Reviewed  TSH - Abnormal; Notable for the following components:      Result Value   TSH 4.770 (*)    All other components within normal limits  RAPID URINE DRUG SCREEN, HOSP PERFORMED - Abnormal; Notable for the following components:   Tetrahydrocannabinol POSITIVE (*)    All other components within normal limits  BASIC METABOLIC PANEL  CBC  D-DIMER, QUANTITATIVE  MAGNESIUM  TROPONIN I (HIGH SENSITIVITY)  TROPONIN I (HIGH SENSITIVITY)    EKG EKG Interpretation  Date/Time:  Wednesday July 16 2021 09:56:59 EDT Ventricular Rate:  65 PR Interval:  219 QRS Duration: 101 QT Interval:  402 QTC Calculation: 418 R Axis:   74 Text Interpretation: Sinus rhythm Prolonged PR interval Confirmed by Coralee Pesa [phone]) on 07/16/2021 11:49:29 AM  Radiology DG Chest 2 View  Result Date: 07/16/2021 CLINICAL  DATA:  Intermittent shortness of breath and chest pain with arm tingling and numbness. EXAM: CHEST - 2 VIEW COMPARISON:  06/10/2021 FINDINGS: The heart size and mediastinal contours are within normal limits. Both lungs are clear. The visualized skeletal structures are unremarkable. IMPRESSION: No active cardiopulmonary disease. Electronically Signed   By: Gaylyn Rong M.D.   On: 07/16/2021 10:16    Procedures Procedures    Medications Ordered in ED Medications  ondansetron (ZOFRAN-ODT) disintegrating tablet 4 mg (4 mg Oral Given 07/16/21 1015)    ED Course/ Medical Decision Making/ A&P  Medical Decision Making Amount and/or Complexity of Data Reviewed Independent Historian: spouse Labs: ordered. Decision-making details documented in ED Course. Radiology: ordered and independent interpretation performed. Decision-making details documented in ED Course. ECG/medicine tests: ordered and independent interpretation performed. Decision-making details documented in ED Course.  Risk Prescription drug management.  Episode of chest tightness, shortness of breath, bilateral arm and leg tingling and numbness now resolved.  Vital stable, no distress, no hypoxia.  EKG without acute ST elevation.  No evidence of Brugada or prolonged QT.  Labs reassuring.  Troponin remains negative.  No evidence of MI.  Did have episode of anxiety, shaking, hyperventilation in the ED that improved with ativan. Was not tachycardic during the time.   Troponin negative x2, doubt ACS. D-dimer negative, doubt PE. TSH minimally elevated, not consistent with hyperthyroidism. UDS with THC.  Suspect anxiety or adverse effect from [facility]. Recommendd THC cessation. Cardiology referral for holter monitor.  Return to the ED with exertional chest pain, pain associated with SOB, vomiting, diaphoresis or any other concerns.        Final Clinical Impression(s) / ED Diagnoses Final diagnoses:   Anxiety  Palpitations  Atypical chest pain    Rx / DC Orders ED Discharge Orders     None         Dannah Ryles, Jeannett Senior, MD 07/16/21 1708

## 2021-07-16 NOTE — ED Triage Notes (Signed)
Pt reports intermittent episodes of SHOB, chest pain, arm tingling and numbness over the past month. Pt reports he was awakened from sleeping with these symptoms this morning. Pt reports being seen before for same and was told he was having panic attacks, but he believes something else could be going on .

## 2021-07-16 NOTE — ED Notes (Signed)
Pt called out for sweats and anxiety. EDP made aware.

## 2021-07-16 NOTE — ED Provider Triage Note (Signed)
Emergency Medicine Provider Triage Evaluation Note  Stephen Burnett , a 26 y.o. male  was evaluated in triage.  Pt complains of episode of shortness of breath, chest pain and numbness all over.  Review of Systems  Positive: Cp, sob, whole body numbness Negative: Headache, weakness  Physical Exam  BP (!) 129/105 (BP Location: Left Arm)   Pulse 61   Temp 97.8 F (36.6 C) (Oral)   Resp 18   SpO2 97%  Gen:   Awake, no distress  , converstaional Resp:  Normal effort , equal breath sounds MSK:   Moves extremities without difficulty , no focal weakness or decreased sensation   Medical Decision Making  Medically screening exam initiated at 10:45 AM.  Appropriate orders placed.  Stephen Burnett was informed that the remainder of the evaluation will be completed by another provider, this initial triage assessment does not replace that evaluation, and the importance of remaining in the ED until their evaluation is complete.     Rozelle Logan, DO 07/16/21 1051

## 2021-07-16 NOTE — Discharge Instructions (Addendum)
Your testing is reassuring.  No evidence of heart attack or blood clot in the lung.  Take the anxiety medication as prescribed.  It may make you sleepy so do not operate heavy machinery or drive while taking this medication.  The cardiologist should call you to arrange a heart monitor to wear at home.  Follow-up with your primary doctor.  Return to the ED with exertional chest pain, pain associate with shortness of breath, nausea, vomiting, sweating, other concerns.

## 2021-07-17 ENCOUNTER — Ambulatory Visit: Payer: Self-pay

## 2021-07-17 ENCOUNTER — Ambulatory Visit: Payer: Self-pay | Admitting: Physician Assistant

## 2021-07-17 ENCOUNTER — Encounter: Payer: Self-pay | Admitting: Physician Assistant

## 2021-07-17 VITALS — BP 129/78 | HR 64 | Resp 18 | Ht 75.0 in | Wt 292.0 lb

## 2021-07-17 DIAGNOSIS — Z6836 Body mass index (BMI) 36.0-36.9, adult: Secondary | ICD-10-CM | POA: Insufficient documentation

## 2021-07-17 DIAGNOSIS — F410 Panic disorder [episodic paroxysmal anxiety] without agoraphobia: Secondary | ICD-10-CM | POA: Insufficient documentation

## 2021-07-17 DIAGNOSIS — R7989 Other specified abnormal findings of blood chemistry: Secondary | ICD-10-CM | POA: Insufficient documentation

## 2021-07-17 DIAGNOSIS — E6609 Other obesity due to excess calories: Secondary | ICD-10-CM | POA: Insufficient documentation

## 2021-07-17 DIAGNOSIS — F411 Generalized anxiety disorder: Secondary | ICD-10-CM

## 2021-07-17 MED ORDER — FLUOXETINE HCL 20 MG PO TABS: 20.0000 mg | ORAL_TABLET | Freq: Every day | ORAL | 2 refills | Status: DC

## 2021-07-17 MED ORDER — LORAZEPAM 0.5 MG PO TABS: 0.5000 mg | ORAL_TABLET | Freq: Two times a day (BID) | ORAL | 0 refills | Status: DC | PRN

## 2021-07-17 MED ORDER — HYDROXYZINE HCL 25 MG PO TABS: ORAL_TABLET | ORAL | 1 refills | Status: DC

## 2021-07-17 NOTE — Progress Notes (Signed)
Established Patient Office Visit  Subjective   Patient ID: Stephen Burnett, male    DOB: Jun 23, 1995  Age: 26 y.o. MRN: [id number]  Chief Complaint  Patient presents with   Anxiety    Episodes of tingling up arms and legs.    States that he was seen in the emergency department yesterday.  Note from that visit  Stephen BastJustin Timm is a 26 y.o. male.   Patient here with concern for "panic attack".  States he woke up from sleep early this morning with left-sided chest pain, shortness of breath, sweating, nauseous hyperventilation.  Symptoms lasted about 90 minutes.  After this patient had tingling and numbness involving both of his arms and both of his legs that lasted for several more hours.  He was seen at the urgent care center and sent to the ED.  Patient reports similar episode in May where he was seen at an outside hospital and diagnosed with a panic disorder.  He has had other episodes since then only at night but less severe than today's.  His chest pain or shortness of breath currently resolved.  His numbness is also resolved.  No weakness in his arms or legs.  Difficulty speaking or difficulty swallowing.  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  His father did have a heart attack at age 28 before passing away at age 28.  Patient himself has never had a stress test. He denies any regular medication use or illicit drug use currently.  He does not have a primary care doctor. He reports a tightness in his chest with shortness of breath and tingling lasted about 90 minutes and who is his most severe episode yet.  He feels back to baseline now   States today that he has been having elevated anxiety and panic attacks for the past month, denies any new stressors.  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  States that he was told at that  time that he has "high functioning anxiety and depression".  States that he was given many different medications to try and offer relief including trazodone and BuSpar.  Reports that he has been managing his anxiety with coping skills, using marijuana, states that he was previously smoking marijuana but has only been using the synthetic type of marijuana since then.  States that he does have difficulty staying asleep.  States that he does wake up several times throughout the night, states that he is sleeping approximately 4 hours.  Has not tried anything recently to help with sleep.  Does endorse drinking liquids up until bedtime.   States that he was given hydroxyzine in the emergency department, states that he took hydroxyzine 25 mg this morning and had a panic attack a few hours later.    07/17/2021   12:39 PM 07/11/2019    8:48 AM  Depression screen PHQ 2/9  Decreased Interest 0 0  Down, Depressed, Hopeless 0 0  PHQ - 2 Score 0 0  Altered sleeping 3   Tired, decreased energy 2   Change in appetite 2   Feeling bad or failure about yourself  0   Trouble concentrating 3   Moving slowly or fidgety/restless 0   Suicidal thoughts 0   PHQ-9 Score 10       07/17/2021  12:40 PM  GAD 7 : Generalized Anxiety Score  Nervous, Anxious, on Edge 3  Control/stop worrying 3  Worry too much - different things 2  Trouble relaxing 3  Restless 3  Easily annoyed or irritable 2  Afraid - awful might happen 3  Total GAD 7 Score 19      Past Medical History:  Diagnosis Date   Asthma    Social History   Socioeconomic History   Marital status: Married    Spouse name: Not on file   Number of children: Not on file   Years of education: Not on file   Highest education level: Not on file  Occupational History   Not on file  Tobacco Use   Smoking status: Every Day    Packs/day: 1.00    Years: 17.00    Total pack years: 17.00    Types: Cigarettes   Smokeless tobacco: Current    Types: Snuff    Tobacco comments:    1/2 pack-pack per day  Vaping Use   Vaping Use: Never used  Substance and Sexual Activity   Alcohol use: Not Currently    Comment: rarely    Drug use: Not Currently   Sexual activity: Not on file  Other Topics Concern   Not on file  Social History Narrative   Not on file   Social Determinants of Health   Financial Resource Strain: Not on file  Food Insecurity: Not on file  Transportation Needs: Not on file  Physical Activity: Not on file  Stress: Not on file  Social Connections: Not on file  Intimate Partner Violence: Not on file   Family History  Problem Relation Age of Onset   Healthy Mother    Heart attack Father    Allergies  Allergen Reactions   Banana Anaphylaxis      Review of Systems  Constitutional: Negative.   HENT: Negative.    Eyes: Negative.   Respiratory:  Negative for shortness of breath.   Cardiovascular:  Negative for chest pain.  Gastrointestinal: Negative.   Genitourinary: Negative.   Musculoskeletal: Negative.   Skin: Negative.   Neurological: Negative.   Endo/Heme/Allergies: Negative.   Psychiatric/Behavioral:  Positive for depression. Negative for suicidal ideas. The patient is nervous/anxious and has insomnia.       Objective:     BP 129/78 (BP Location: Left Arm, Patient Position: Sitting, Cuff Size: Normal)   Pulse 64   Resp 18   Ht 6\' 3"  (1.905 m)   Wt 292 lb (132.5 kg)   SpO2 99%   BMI 36.50 kg/m    Physical Exam Vitals and nursing note reviewed.  Constitutional:      Appearance: Normal appearance. He is obese.  HENT:     Head: Normocephalic and atraumatic.     Right Ear: External ear normal.     Left Ear: External ear normal.     Nose: Nose normal.     Mouth/Throat:     Mouth: Mucous membranes are moist.     Pharynx: Oropharynx is clear.  Eyes:     Extraocular Movements: Extraocular movements intact.     Conjunctiva/sclera: Conjunctivae normal.     Pupils: Pupils are equal, round, and  reactive to light.  Cardiovascular:     Rate and Rhythm: Normal rate and regular rhythm.     Pulses: Normal pulses.     Heart sounds: Normal heart sounds.  Pulmonary:     Effort: Pulmonary effort is normal.  Breath sounds: Normal breath sounds.  Musculoskeletal:        General: Normal range of motion.     Cervical back: Normal range of motion and neck supple.  Skin:    General: Skin is warm and dry.  Neurological:     General: No focal deficit present.     Mental Status: He is alert and oriented to person, place, and time.  Psychiatric:        Attention and Perception: Attention normal.        Mood and Affect: Mood is anxious.        Speech: Speech normal.        Behavior: Behavior normal.        Thought Content: Thought content normal. Thought content does not include homicidal or suicidal ideation.        Judgment: Judgment normal.       Assessment & Plan:   Problem List Items Addressed This Visit   None Visit Diagnoses     GAD (generalized anxiety disorder)    -  Primary   Relevant Medications   FLUoxetine (PROZAC) 20 MG tablet   LORazepam (ATIVAN) 0.5 MG tablet   hydrOXYzine (ATARAX) 25 MG tablet   Other Relevant Orders   Vitamin D, 25-hydroxy   Panic disorder       Relevant Medications   FLUoxetine (PROZAC) 20 MG tablet   LORazepam (ATIVAN) 0.5 MG tablet   hydrOXYzine (ATARAX) 25 MG tablet   Abnormal thyroid blood test       Relevant Orders   Thyroid Panel With TSH      1. GAD (generalized anxiety disorder) Trial Prozac, increase hydroxyzine.  Patient education given on supportive care, coping skills.  Red flags given for prompt reevaluation.  Patient has appointment to establish care with Delfin Gant at [facility] on August 05, 2021.  Encouraged to follow-up with mobile team if needed. - Vitamin D, 25-hydroxy - FLUoxetine (PROZAC) 20 MG tablet; Take 1 tablet (20 mg total) by mouth daily.  Dispense: 30 tablet; Refill: 2 - hydrOXYzine  (ATARAX) 25 MG tablet; Take 1-2 tabs PO q6hrs PRN  Dispense: 60 tablet; Refill: 1  2. Panic disorder Check of [place] controlled substance registry appropriate.  Short trial of Ativan. - LORazepam (ATIVAN) 0.5 MG tablet; Take 1 tablet (0.5 mg total) by mouth 2 (two) times daily as needed for anxiety.  Dispense: 20 tablet; Refill: 0  3. Abnormal thyroid blood test  - Thyroid Panel With TSH  4. Class 2 obesity due to excess calories without serious comorbidity with body mass index (BMI) of 36.0 to 36.9 in adult    I have reviewed the patient's medical history (PMH, PSH, Social History, Family History, Medications, and allergies) , and have been updated if relevant. I spent 30 minutes reviewing chart and  face to face time with patient.    Return if symptoms worsen or fail to improve.    Kasandra Knudsen Mayers, PA-C

## 2021-07-17 NOTE — Patient Instructions (Addendum)
You are going to start taking Prozac on a daily basis, it may take 3 to 4 weeks before you notice any difference.  The most important thing is to be consistent with the medication.  To help you with your anxiety right now, you can increase the hydroxyzine, you can take 1-2 tabs every 6 hours as needed.  If you feel a full panic attack coming on and the hydroxyzine is not offering relief, you can use the Ativan as directed  It is very important that you work to get 7 to 8 hours of sleep at night.  You can use the hydroxyzine to help you with sleep, or you can also try melatonin.  We will call you with today's lab results.  Please let us know if there is anything else we can do for you.  Roney Jaffe, PA-C Physician Assistant [facility] [email or web address]   Managing Anxiety, Adult After being diagnosed with anxiety, you may be relieved to know why you have felt or behaved a certain way. You may also feel overwhelmed about the treatment ahead and what it will mean for your life. With care and support, you can manage this condition. How to manage lifestyle changes Managing stress and anxiety  Stress is your body's reaction to life changes and events, both good and bad. Most stress will last just a few hours, but stress can be ongoing and can lead to more than just stress. Although stress can play a major role in anxiety, it is not the same as anxiety. Stress is usually caused by something external, such as a deadline, test, or competition. Stress normally passes after the triggering event has ended.  Anxiety is caused by something internal, such as imagining a terrible outcome or worrying that something will go wrong that will devastate you. Anxiety often does not go away even after the triggering event is over, and it can become long-term (chronic) worry. It is important to understand the differences between stress and anxiety and to manage  your stress effectively so that it does not lead to an anxious response. Talk with your health care provider or a counselor to learn more about reducing anxiety and stress. He or she may suggest tension reduction techniques, such as: Music therapy. Spend time creating or listening to music that you enjoy and that inspires you. Mindfulness-based meditation. Practice being aware of your normal breaths while not trying to control your breathing. It can be done while sitting or walking. Centering prayer. This involves focusing on a word, phrase, or sacred image that means something to you and brings you peace. Deep breathing. To do this, expand your stomach and inhale slowly through your nose. Hold your breath for 3-5 seconds. Then exhale slowly, letting your stomach muscles relax. Self-talk. Learn to notice and identify thought patterns that lead to anxiety reactions and change those patterns to thoughts that feel peaceful. Muscle relaxation. Taking time to tense muscles and then relax them. Choose a tension reduction technique that fits your lifestyle and personality. These techniques take time and practice. Set aside 5-15 minutes a day to do them. Therapists can offer counseling and training in these techniques. The training to help with anxiety may be covered by some insurance plans. Other things you can do to manage stress and anxiety include: Keeping a stress diary. This can help you learn what triggers your reaction and then learn ways to manage your response. Thinking about how you react to certain situations. You may  not be able to control everything, but you can control your response. Making time for activities that help you relax and not feeling guilty about spending your time in this way. Doing visual imagery. This involves imagining or creating mental pictures to help you relax. Practicing yoga. Through yoga poses, you can lower tension and promote relaxation.  Medicines Medicines can help  ease symptoms. Medicines for anxiety include: Antidepressant medicines. These are usually prescribed for long-term daily control. Anti-anxiety medicines. These may be added in severe cases, especially when panic attacks occur. Medicines will be prescribed by a health care provider. When used together, medicines, psychotherapy, and tension reduction techniques may be the most effective treatment. Relationships Relationships can play a big part in helping you recover. Try to spend more time connecting with trusted friends and family members. Consider going to couples counseling if you have a partner, taking family education classes, or going to family therapy. Therapy can help you and others better understand your condition. How to recognize changes in your anxiety Everyone responds differently to treatment for anxiety. Recovery from anxiety happens when symptoms decrease and stop interfering with your daily activities at home or work. This may mean that you will start to: Have better concentration and focus. Worry will interfere less in your daily thinking. Sleep better. Be less irritable. Have more energy. Have improved memory. It is also important to recognize when your condition is getting worse. Contact your health care provider if your symptoms interfere with home or work and you feel like your condition is not improving. Follow these instructions at home: Activity Exercise. Adults should do the following: Exercise for at least 150 minutes each week. The exercise should increase your heart rate and make you sweat (moderate-intensity exercise). Strengthening exercises at least twice a week. Get the right amount and quality of sleep. Most adults need 7-9 hours of sleep each night. Lifestyle  Eat a healthy diet that includes plenty of vegetables, fruits, whole grains, low-fat dairy products, and lean protein. Do not eat a lot of foods that are high in fats, added sugars, or salt  (sodium). Make choices that simplify your life. Do not use any products that contain nicotine or tobacco. These products include cigarettes, chewing tobacco, and vaping devices, such as e-cigarettes. If you need help quitting, ask your health care provider. Avoid caffeine, alcohol, and certain over-the-counter cold medicines. These may make you feel worse. Ask your pharmacist which medicines to avoid. General instructions Take over-the-counter and prescription medicines only as told by your health care provider. Keep all follow-up visits. This is important. Where to find support You can get help and support from these sources: Self-help groups. Online and [employer] organizations. A trusted spiritual leader. Couples counseling. Family education classes. Family therapy. Where to find more information You may find that joining a support group helps you deal with your anxiety. The following sources can help you locate counselors or support groups near you: Mental Health America: www.mentalhealthamerica.net Anxiety and Depression Association of [place] (ADAA): ProgramCam.dewww.adaa.org The [employer] Alliance on Mental Illness (NAMI): www.nami.org Contact a health care provider if: You have a hard time staying focused or finishing daily tasks. You spend many hours a day feeling worried about everyday life. You become exhausted by worry. You start to have headaches or frequently feel tense. You develop chronic nausea or diarrhea. Get help right away if: You have a racing heart and shortness of breath. You have thoughts of hurting yourself or others. If you ever feel like you  may hurt yourself or others, or have thoughts about taking your own life, get help right away. Go to your nearest emergency department or: Call your local emergency services (911 in the [place] Call a suicide crisis helpline, such as the National Suicide Prevention Lifeline at 437-141-6834 or 988 in the [place] This is open 24 hours a day in  the [place] Text the Crisis Text Line at (803)349-9976 (in the [place] Summary Taking steps to learn and use tension reduction techniques can help calm you and help prevent triggering an anxiety reaction. When used together, medicines, psychotherapy, and tension reduction techniques may be the most effective treatment. Family, friends, and partners can play a big part in supporting you. This information is not intended to replace advice given to you by your health care provider. Make sure you discuss any questions you have with your health care provider. Document Revised: 08/21/2020 Document Reviewed: 05/19/2020 Elsevier Patient Education  2023 [employer].

## 2021-07-17 NOTE — Telephone Encounter (Signed)
1 hour after last triage panic atttack resumed  Chief Complaint: panic attack; Med prescribed by ED not helping Symptoms: tense,restless,panicky,anxious,keyed up, very overwhelmed Frequency: has had 2 today Pertinent Negatives: Patient denies suicidal ideation or plan Disposition: [] ED /[] Urgent Care (no appt availability in office) / [] Appointment(In office/virtual)/ []  [facility]/ [] Home Care/ [] Refused Recommended Disposition /[x]  Mobile Bus/ []  Follow-up with PCP Additional Notes: no appts avaialble. Agrees to go to mobile bus. Address given and hours of operation.     Reason for Disposition  Patient sounds very upset or troubled to the triager  Answer Assessment - Initial Assessment Questions 1. CONCERN: "Did anything happen that prompted you to call today?"      Continuing  2. ANXIETY SYMPTOMS: "Can you describe how you (your loved one; patient) have been feeling?" (e.g., tense, restless, panicky, anxious, keyed up, overwhelmed, sense of impending doom).      Tense, restless, panicky, anxious,keyed up, overwhelmed is worst symptoms 3. ONSET: "How long have you been feeling this way?" (e.g., hours, days, weeks)     *No Answer* 4. SEVERITY: "How would you rate the level of anxiety?" (e.g., 0 - 10; or mild, moderate, severe).     severe 5. FUNCTIONAL IMPAIRMENT: "How have these feelings affected your ability to do daily activities?" "Have you had more difficulty than usual doing your normal daily activities?" (e.g., getting better, same, worse; self-care, school, work, interactions)     *No Answer* 6. HISTORY: "Have you felt this way before?" "Have you ever been diagnosed with an anxiety problem in the past?" (e.g., generalized anxiety disorder, panic attacks, PTSD). If Yes, ask: "How was this problem treated?" (e.g., medicines, counseling, etc.)     Panic attacks started 1 month ago 7. RISK OF HARM - SUICIDAL IDEATION: "Do you ever have thoughts of hurting or  killing yourself?" If Yes, ask:  "Do you have these feelings now?" "Do you have a plan on how you would do this?"     no 8. TREATMENT:  "What has been done so far to treat this anxiety?" (e.g., medicines, relaxation strategies). "What has helped?"     Hydroxyzine not helping 9. TREATMENT - THERAPIST: "Do you have a counselor or therapist? Name?"     no 10. POTENTIAL TRIGGERS: "Do you drink caffeinated beverages (e.g., coffee, colas, teas), and how much daily?" "Do you drink alcohol or use any drugs?" "Have you started any new medicines recently?"     *No Answer* 10. PATIENT SUPPORT: "Who is with you now?" "Who do you live with?" "Do you have family or friends who you can talk to?"        wife 42. OTHER SYMPTOMS: "Do you have any other symptoms?" (e.g., feeling depressed, trouble concentrating, trouble sleeping, trouble breathing, palpitations or fast heartbeat, chest pain, sweating, nausea, or diarrhea)       Felt like he was going to pass out.  Protocols used: Anxiety and Panic Attack-A-AH

## 2021-07-17 NOTE — Telephone Encounter (Signed)
  Chief Complaint: anxiety  Symptoms: anxiety and panic attack, chest racing, SOB, tingling in arms and legs  Frequency: 1 month, first episode on 06/11/21 Pertinent Negatives: NA Disposition: [] ED /[] Urgent Care (no appt availability in office) / [x] Appointment(In office/virtual)/ []  [place] [facility]/ [] Home Care/ [] Refused Recommended Disposition /[] [place] [facility]/ []  Follow-up with PCP Additional Notes: pt went to ED yesterday was diagnosed with panic attack and given medication. He states he took it yesterday and slept better than usual but had episode this morning. Pt still feels shaky and jittery but took the hydroxyzine this morning and went outside so has gotten better. Scheduled pt for ED fu on 08/05/21 at 1000. Advice given for when to return to ED. Pt verbalized understanding.   Reason for Disposition  [1] Symptoms of anxiety or panic attack AND [2] is a chronic symptom (recurrent or ongoing AND present > 4 weeks)  Answer Assessment - Initial Assessment Questions 1. CONCERN: "Did anything happen that prompted you to call today?"      Panic attacks  2. ANXIETY SYMPTOMS: "Can you describe how you (your loved one; patient) have been feeling?" (e.g., tense, restless, panicky, anxious, keyed up, overwhelmed, sense of impending doom).      Feels tingling and numbness arms and legs, chest hurting, feeling SOB 3. ONSET: "How long have you been feeling this way?" (e.g., hours, days, weeks)     1 month, first one was May 3rd  5. FUNCTIONAL IMPAIRMENT: "How have these feelings affected your ability to do daily activities?" "Have you had more difficulty than usual doing your normal daily activities?" (e.g., getting better, same, worse; self-care, school, work, interactions)     Affecting daily routine  6. HISTORY: "Have you felt this way before?" "Have you ever been diagnosed with an anxiety problem in the past?" (e.g., generalized anxiety disorder, panic attacks, PTSD). If Yes, ask:  "How was this problem treated?" (e.g., medicines, counseling, etc.)     No  7. RISK OF HARM - SUICIDAL IDEATION: "Do you ever have thoughts of hurting or killing yourself?" If Yes, ask:  "Do you have these feelings now?" "Do you have a plan on how you would do this?"     No  8. TREATMENT:  "What has been done so far to treat this anxiety?" (e.g., medicines, relaxation strategies). "What has helped?"     Was given medicine yesterday from ED  10. POTENTIAL TRIGGERS: "Do you drink caffeinated beverages (e.g., coffee, colas, teas), and how much daily?" "Do you drink alcohol or use any drugs?" "Have you started any new medicines recently?"     Just vaping, drinking occasional  10. PATIENT SUPPORT: "Who is with you now?" "Who do you live with?" "Do you have family or friends who you can talk to?"        Wife  11. OTHER SYMPTOMS: "Do you have any other symptoms?" (e.g., feeling depressed, trouble concentrating, trouble sleeping, trouble breathing, palpitations or fast heartbeat, chest pain, sweating, nausea, or diarrhea)  Protocols used: Anxiety and Panic Attack-A-AH

## 2021-07-18 ENCOUNTER — Ambulatory Visit: Payer: Self-pay

## 2021-07-18 ENCOUNTER — Ambulatory Visit: Payer: Self-pay | Admitting: *Deleted

## 2021-07-18 LAB — THYROID PANEL WITH TSH
Free Thyroxine Index: 1.7 (ref 1.2–4.9)
T3 Uptake Ratio: 23 % — ABNORMAL LOW (ref 24–39)
T4, Total: 7.6 ug/dL (ref 4.5–12.0)
TSH: 2.08 u[IU]/mL (ref 0.450–4.500)

## 2021-07-18 LAB — VITAMIN D 25 HYDROXY (VIT D DEFICIENCY, FRACTURES): Vit D, 25-Hydroxy: 24.8 ng/mL — ABNORMAL LOW (ref 30.0–100.0)

## 2021-07-18 NOTE — Telephone Encounter (Signed)
  Chief Complaint: Wanting to know if he can taking a 2nd lorazepam if he took the first one at 8:30 this morning Symptoms: Feeling "antsy" Frequency: Now  Pertinent Negatives: Patient denies N/A Disposition: [] ED /[] Urgent Care (no appt availability in office) / [] Appointment(In office/virtual)/ []  [facility]/ [] Home Care/ [] Refused Recommended Disposition /[] [facility]/ [x]  Follow-up with PCP Additional Notes: Message sent to [facility] for [place], 

## 2021-07-18 NOTE — Telephone Encounter (Signed)
    Chief Complaint: Pt. Concerned about side effects of Prozac and does not want to start it. Symptoms: n/a Frequency: n/a Pertinent Negatives: Patient denies n/a Disposition: [] ED /[] Urgent Care (no appt availability in office) / [] Appointment(In office/virtual)/ []  [place] [facility]/ [] Home Care/ [] Refused Recommended Disposition /[] [place] [facility]/ []  Follow-up with PCP Additional Notes: States Ativan "has helped a lot." Has up coming appointment and will discuss medication at that time. Instructed to call back as needed.  Answer Assessment - Initial Assessment Questions 1. NAME of MEDICATION: "What medicine are you calling about?"     Prozac 2. QUESTION: "What is your question?" (e.g., double dose of medicine, side effect)     Side effects 3. PRESCRIBING HCP: "Who prescribed it?" Reason: if prescribed by specialist, call should be referred to that group.     [facility] 4. SYMPTOMS: "Do you have any symptoms?"     No 5. SEVERITY: If symptoms are present, ask "Are they mild, moderate or severe?"     Has had panic attacks 6. PREGNANCY:  "Is there any chance that you are pregnant?" "When was your last menstrual period?"     N/a  Protocols used: Medication Question Call-A-AH

## 2021-07-18 NOTE — Telephone Encounter (Signed)
Reason for Disposition  [1] Caller has NON-URGENT medicine question about med that PCP prescribed AND [2] triager unable to answer question  Answer Assessment - Initial Assessment Questions 1. NAME of MEDICATION: "What medicine are you calling about?"     Lorazepam 2. QUESTION: "What is your question?" (e.g., double dose of medicine, side effect)     Can I take another one?   I can have 1-2 a day.   I took one this morning at 8:30 AM.   Can I take another one now?   I'm just starting to feel "antsy".  3. PRESCRIBING HCP: "Who prescribed it?" Reason: if prescribed by specialist, call should be referred to that group.     Cari [facility]ers, PA on the Mobile [facility] 4. SYMPTOMS: "Do you have any symptoms?"     Feeling "antsy"  The lorazepam helps. 5. SEVERITY: If symptoms are present, ask "Are they mild, moderate or severe?"     Mild right now 6. PREGNANCY:  "Is there any chance that you are pregnant?" "When was your last menstrual period?"     N/A  Protocols used: Medication Question Call-A-AH

## 2021-07-21 ENCOUNTER — Ambulatory Visit: Payer: Self-pay | Admitting: *Deleted

## 2021-07-21 ENCOUNTER — Encounter: Payer: Self-pay | Admitting: Cardiology

## 2021-07-21 ENCOUNTER — Ambulatory Visit (INDEPENDENT_AMBULATORY_CARE_PROVIDER_SITE_OTHER): Payer: Self-pay | Admitting: Cardiology

## 2021-07-21 ENCOUNTER — Ambulatory Visit: Payer: Self-pay

## 2021-07-21 ENCOUNTER — Telehealth: Payer: Self-pay | Admitting: Internal Medicine

## 2021-07-21 VITALS — BP 126/68 | HR 86 | Ht 75.0 in | Wt 284.0 lb

## 2021-07-21 DIAGNOSIS — R002 Palpitations: Secondary | ICD-10-CM

## 2021-07-21 DIAGNOSIS — R079 Chest pain, unspecified: Secondary | ICD-10-CM

## 2021-07-21 LAB — BASIC METABOLIC PANEL
BUN/Creatinine Ratio: 16 (ref 9–20)
BUN: 16 mg/dL (ref 6–20)
CO2: 22 mmol/L (ref 20–29)
Calcium: 10 mg/dL (ref 8.7–10.2)
Chloride: 102 mmol/L (ref 96–106)
Creatinine, Ser: 1.03 mg/dL (ref 0.76–1.27)
Glucose: 96 mg/dL (ref 70–99)
Potassium: 4.5 mmol/L (ref 3.5–5.2)
Sodium: 139 mmol/L (ref 134–144)
eGFR: 101 mL/min/{1.73_m2} (ref >59)

## 2021-07-21 MED ORDER — METOPROLOL TARTRATE 100 MG PO TABS: 100.0000 mg | ORAL_TABLET | ORAL | 0 refills | Status: DC

## 2021-07-21 NOTE — Addendum Note (Signed)
Addended by: Cleda Mccreedy on: 07/21/2021 10:47 AM   Modules accepted: Orders

## 2021-07-21 NOTE — Patient Instructions (Signed)
Medication Instructions:  Your physician recommends that you continue on your current medications as directed. Please refer to the Current Medication list given to you today.  *If you need a refill on your cardiac medications before your next appointment, please call your pharmacy*   Lab Work: TODAY: BMET If you have labs (blood work) drawn today and your tests are completely normal, you will receive your results only by: MyChart Message (if you have MyChart) OR A paper copy in the mail If you have any lab test that is abnormal or we need to change your treatment, we will call you to review the results.   Testing/Procedures: Your physician has requested that you have an echocardiogram. Echocardiography is a painless test that uses sound waves to create images of your heart. It provides your doctor with information about the size and shape of your heart and how well your heart's chambers and valves are working. This procedure takes approximately one hour. There are no restrictions for this procedure.  Follow-Up: At [facility], you and your health needs are our priority.  As part of our continuing mission to provide you with exceptional heart care, we have created designated Provider Care Teams.  These Care Teams include your primary Cardiologist (physician) and Advanced Practice Providers (APPs -  Physician Assistants and Nurse Practitioners) who all work together to provide you with the care you need, when you need it.   Your next appointment:   As needed  Other Instructions See letter for CTA Instructions  ZIO XT- Long Term Monitor Instructions  Your physician has requested you wear a ZIO patch monitor for 14 days.  This is a single patch monitor. [employer] supplies one patch monitor per enrollment. Additional stickers are not available. Please do not apply patch if you will be having a Nuclear Stress Test,  Echocardiogram, Cardiac CT, MRI, or Chest Xray during the period you would  be wearing the  monitor. The patch cannot be worn during these tests. You cannot remove and re-apply the  ZIO XT patch monitor.  Your ZIO patch monitor will be mailed 3 day USPS to your address on file. It may take 3-5 days  to receive your monitor after you have been enrolled.  Once you have received your monitor, please review the enclosed instructions. Your monitor  has already been registered assigning a specific monitor serial # to you.  Billing and Patient Assistance Program Information  We have supplied [employer] with any of your insurance information on file for billing purposes. [employer] offers a sliding scale Patient Assistance Program for patients that do not have  insurance, or whose insurance does not completely cover the cost of the ZIO monitor.  You must apply for the Patient Assistance Program to qualify for this discounted rate.  To apply, please call [employer] at [phone], select option 4, select option 2, ask to apply for  Patient Assistance Program. Meredeth Ide will ask your household income, and how many people  are in your household. They will quote your out-of-pocket cost based on that information.  [employer] will also be able to set up a 70-month, interest-free payment plan if needed.  Applying the monitor   Shave hair from upper left chest.  Hold abrader disc by orange tab. Rub abrader in 40 strokes over the upper left chest as  indicated in your monitor instructions.  Clean area with 4 enclosed alcohol pads. Let dry.  Apply patch as indicated in monitor instructions. Patch will be placed under collarbone  on left  side of chest with arrow pointing upward.  Rub patch adhesive wings for 2 minutes. Remove white label marked "1". Remove the white  label marked "2". Rub patch adhesive wings for 2 additional minutes.  While looking in a mirror, press and release button in center of patch. A small green light will  flash 3-4 times. This will be your only indicator that the  monitor has been turned on.  Do not shower for the first 24 hours. You may shower after the first 24 hours.  Press the button if you feel a symptom. You will hear a small click. Record Date, Time and  Symptom in the Patient Logbook.  When you are ready to remove the patch, follow instructions on the last 2 pages of Patient  Logbook. Stick patch monitor onto the last page of Patient Logbook.  Place Patient Logbook in the blue and white box. Use locking tab on box and tape box closed  securely. The blue and white box has prepaid postage on it. Please place it in the mailbox as  soon as possible. Your physician should have your test results approximately 7 days after the  monitor has been mailed back to [facility].  Call [facility] Customer Care at [phone] if you have questions regarding  your ZIO XT patch monitor. Call them immediately if you see an orange light blinking on your  monitor.  If your monitor falls off in less than 4 days, contact our Monitor department at [phone].  If your monitor becomes loose or falls off after 4 days call [employer] at [phone] for  suggestions on securing your monitor

## 2021-07-21 NOTE — Telephone Encounter (Signed)
Opened chart to give lab results however there isn't an interpretation of the results from [facility] to give to pt.  Agent is going to send a message.

## 2021-07-21 NOTE — Addendum Note (Signed)
Addended by: Cleda Mccreedy on: 07/21/2021 10:43 AM   Modules accepted: Orders

## 2021-07-21 NOTE — Telephone Encounter (Signed)
  Chief Complaint: Pt is not having his normal morning erection. His penis also feels a little numb. Symptoms:  Frequency:  Pertinent Negatives: Patient denies  Disposition: [] ED /[] Urgent Care (no appt availability in office) / [] Appointment(In office/virtual)/ []  [place] [facility]/ [] Home Care/ [] Refused Recommended Disposition /[] [place] [facility]/ [x]  Follow-up with PCP Additional Notes: Pt would like to know if this is a side effect of new medications. Also if this is a side effect will it resolve. If it will not pt would like a different medication.    Reason for Disposition  [1] Caller has NON-URGENT medicine question about med that PCP prescribed AND [2] triager unable to answer question  Answer Assessment - Initial Assessment Questions 1. NAME of MEDICATION: "What medicine are you calling about?"     Prozac Lorazepam, and hydroxyzine 2. QUESTION: "What is your question?" (e.g., double dose of medicine, side effect)     Pt is not having his normal morning erections. Pt wants to know if this may be from the new medications. 3. PRESCRIBING HCP: "Who prescribed it?" Reason: if prescribed by specialist, call should be referred to that group.      4. SYMPTOMS: "Do you have any symptoms?"      5. SEVERITY: If symptoms are present, ask "Are they mild, moderate or severe?"      6. PREGNANCY:  "Is there any chance that you are pregnant?" "When was your last menstrual period?"     na  Protocols used: Medication Question Call-A-AH

## 2021-07-21 NOTE — Telephone Encounter (Signed)
Pt returning call to [facility]  bus for lab results done on mobile. Please cal back

## 2021-07-21 NOTE — Progress Notes (Unsigned)
Enrolled for [employer] to mail a ZIO XT long term holter monitor to the patients address on file.  

## 2021-07-21 NOTE — Progress Notes (Signed)
Cardiology CONSULT Note    Date:  07/21/2021   ID:  Stephen Burnett, DOB 06-01-1995, MRN [id number]  PCP:  Arvilla MarketWallace, Catherine Lauren, MD  Cardiologist:  Armanda Magicraci Yoav Okane, MD   Chief Complaint  Patient presents with   New Patient (Initial Visit)    Palpitations     History of Present Illness:  Stephen Burnett is a 26 y.o. male who is being seen today for the evaluation of palpitations and chest pain at the request of Stephen Burnett, Catherine Laur*.  This is a [age] year old male with a history of asthma who presents for evaluation of palpitations.He says that he mainly noticed on his apple watch that his heart rate was dropping below 38-42bpm when sleeping and then 48 to 110bpm when awake.  He also has had some problems with chest pain and was seen in the ER on 07/16/21 with CP.  Workup at that time was felt to be related to panic attack.    He awakened from sleep with left sided chest pain with numbness and tingling in both arms and both legs lasting several hours.  He was diaphoretic and had to stop at Urgent care on way to ER and EMS was called to transport to ER.  Workup in the ER was normal except for UDS with THC and felt to be a panic attack.  He has had other episodes and was evaluated in May that awakened him from sleep.  He has a hx of Methamphetamine use but clean for 5 years.  He used to smoke 2-3ppd but quit 8 months ago.  He has a strong fm hx of premature CAD with first MI at age 26 and then 6 other MIs and died of MI at [age].  His MGF had CHF.  He does complain of DOE but has asthma. He denies any LE edema, palpitations, syncope.   Past Medical History:  Diagnosis Date   Asthma    Chest pain    Palpitations     No past surgical history on file.  Current Medications: Current Meds  Medication Sig   albuterol (PROVENTIL) (2.5 MG/3ML) 0.083% nebulizer solution Take 3 mLs (2.5 mg total) by nebulization every 6 (six) hours as needed for wheezing or shortness of breath.   albuterol  (VENTOLIN HFA) 108 (90 Base) MCG/ACT inhaler Inhale 2 puffs into the lungs every 6 (six) hours as needed for wheezing or shortness of breath.   FLUoxetine (PROZAC) 20 MG tablet Take 1 tablet (20 mg total) by mouth daily.   hydrOXYzine (ATARAX) 25 MG tablet Take 1-2 tabs PO q6hrs PRN   LORazepam (ATIVAN) 0.5 MG tablet Take 1 tablet (0.5 mg total) by mouth 2 (two) times daily as needed for anxiety.   melatonin 5 MG TABS Take 5 mg by mouth at bedtime as needed (sleep).    Allergies:   Banana   Social History   Socioeconomic History   Marital status: Married    Spouse name: Not on file   Number of children: Not on file   Years of education: Not on file   Highest education level: Not on file  Occupational History   Not on file  Tobacco Use   Smoking status: Every Day    Packs/day: 1.00    Years: 17.00    Total pack years: 17.00    Types: Cigarettes   Smokeless tobacco: Current    Types: Snuff   Tobacco comments:    1/2 pack-pack per day  Vaping Use   Vaping  Use: Never used  Substance and Sexual Activity   Alcohol use: Not Currently    Comment: rarely    Drug use: Not Currently   Sexual activity: Not on file  Other Topics Concern   Not on file  Social History Narrative   Not on file   Social Determinants of Health   Financial Resource Strain: Not on file  Food Insecurity: Not on file  Transportation Needs: Not on file  Physical Activity: Not on file  Stress: Not on file  Social Connections: Not on file     Family History:  The patient's family history includes Healthy in his mother; Heart attack in his father.   ROS:   Please see the history of present illness.    ROS All other systems reviewed and are negative.      No data to display             PHYSICAL EXAM:   VS:  BP 126/68   Pulse 86   Ht 6\' 3"  (1.905 m)   Wt 284 lb (128.8 kg)   SpO2 98%   BMI 35.50 kg/m    GEN: Well nourished, well developed, in no acute distress  HEENT: normal  Neck: no  JVD, carotid bruits, or masses Cardiac: RRR; no murmurs, rubs, or gallops,no edema.  Intact distal pulses bilaterally.  Respiratory:  clear to auscultation bilaterally, normal work of breathing GI: soft, nontender, nondistended, + BS MS: no deformity or atrophy  Skin: warm and dry, no rash Neuro:  Alert and Oriented x 3, Strength and sensation are intact Psych: euthymic mood, full affect  Wt Readings from Last 3 Encounters:  07/21/21 284 lb (128.8 kg)  07/17/21 292 lb (132.5 kg)  06/10/21 290 lb (131.5 kg)      Studies/Labs Reviewed:   EKG:  EKG is not ordered today.    Recent Labs: 07/16/2021: BUN 15; Creatinine, Ser 0.94; Hemoglobin 15.6; Magnesium 2.0; Platelets 153; Potassium 4.0; Sodium 140 07/17/2021: TSH 2.080   Lipid Panel    Component Value Date/Time   CHOL 169 07/11/2019 0850   TRIG 116 07/11/2019 0850   HDL 35 (L) 07/11/2019 0850   CHOLHDL 4.8 07/11/2019 0850   LDLCALC 113 (H) 07/11/2019 0850    Additional studies/ records that were reviewed today include:  Office visit notes from PCP, ER notes, EKG and labs from ER    ASSESSMENT:    1. Palpitations   2. Chest pain of uncertain etiology      PLAN:  In order of problems listed above:  Palpitations -Twelve-lead EKG is normal -order 2-week Zio patch to determine if he is having any arrhythmias  2.  Chest pain -atypical in that both episodes that he had occurred at rest while sleeping but were associated with bilateral arm tingling and numbness but also in his legs -suspect this is related to panic attacks -he does have a strong fm hx of premature CAD and has smoked heavily -will get a coronary CTA to define coronary anatomy -check 2D echo   Time Spent: 25 minutes total time of encounter, including 15 minutes spent in face-to-face patient care on the date of this encounter. This time includes coordination of care and counseling regarding above mentioned problem list. Remainder of non-face-to-face time  involved reviewing chart documents/testing relevant to the patient encounter and documentation in the medical record. I have independently reviewed documentation from referring provider  Medication Adjustments/Labs and Tests Ordered: Current medicines are reviewed at length with the  patient today.  Concerns regarding medicines are outlined above.  Medication changes, Labs and Tests ordered today are listed in the Patient Instructions below.  There are no Patient Instructions on file for this visit.   Signed, Armanda Magic, MD  07/21/2021 10:30 AM    [facility] [address] [place], [place], [place]  [address] Phone: [phone]; Fax: [phone]

## 2021-07-24 NOTE — Telephone Encounter (Signed)
Patient states he was better now. Issue has been resolved.

## 2021-07-28 ENCOUNTER — Other Ambulatory Visit: Payer: Self-pay | Admitting: Physician Assistant

## 2021-07-28 DIAGNOSIS — F410 Panic disorder [episodic paroxysmal anxiety] without agoraphobia: Secondary | ICD-10-CM

## 2021-07-29 ENCOUNTER — Other Ambulatory Visit: Payer: Self-pay | Admitting: Physician Assistant

## 2021-07-29 DIAGNOSIS — F410 Panic disorder [episodic paroxysmal anxiety] without agoraphobia: Secondary | ICD-10-CM

## 2021-07-29 MED ORDER — LORAZEPAM 0.5 MG PO TABS: 0.5000 mg | ORAL_TABLET | Freq: Two times a day (BID) | ORAL | 0 refills | Status: DC | PRN

## 2021-07-29 NOTE — Progress Notes (Signed)
Review of [place] controlled substance registry appropriate.  Roney Jaffe, PA-C Physician Assistant [facility] Medicine [email or web address]

## 2021-07-30 NOTE — Progress Notes (Signed)
Erroneous encounter - disregard. Patient to establish with Georganna Skeans, MD.

## 2021-08-05 ENCOUNTER — Encounter: Payer: Self-pay | Admitting: Family Medicine

## 2021-08-05 ENCOUNTER — Ambulatory Visit (INDEPENDENT_AMBULATORY_CARE_PROVIDER_SITE_OTHER): Payer: Self-pay | Admitting: Family Medicine

## 2021-08-05 VITALS — BP 135/79 | HR 61 | Temp 98.1°F | Resp 16 | Ht 75.0 in | Wt 297.0 lb

## 2021-08-05 DIAGNOSIS — J4520 Mild intermittent asthma, uncomplicated: Secondary | ICD-10-CM

## 2021-08-05 DIAGNOSIS — F17200 Nicotine dependence, unspecified, uncomplicated: Secondary | ICD-10-CM

## 2021-08-05 DIAGNOSIS — Z7689 Persons encountering health services in other specified circumstances: Secondary | ICD-10-CM

## 2021-08-05 DIAGNOSIS — F411 Generalized anxiety disorder: Secondary | ICD-10-CM

## 2021-08-05 MED ORDER — ALBUTEROL SULFATE HFA 108 (90 BASE) MCG/ACT IN AERS: 2.0000 | INHALATION_SPRAY | Freq: Four times a day (QID) | RESPIRATORY_TRACT | 3 refills | Status: DC | PRN

## 2021-08-07 ENCOUNTER — Encounter: Payer: Self-pay | Admitting: Family Medicine

## 2021-08-07 NOTE — Progress Notes (Signed)
New Patient Office Visit  Subjective    Patient ID: Stephen Burnett, male    DOB: [birth year]  Age: 26 y.o. MRN: [id number]  CC:  Chief Complaint  Patient presents with   Follow-up    ED    HPI Stephen Burnett presents to establish care and for review of chronic med issues including asthma.   Outpatient Encounter Medications as of 08/05/2021  Medication Sig   albuterol (PROVENTIL) (2.5 MG/3ML) 0.083% nebulizer solution Take 3 mLs (2.5 mg total) by nebulization every 6 (six) hours as needed for wheezing or shortness of breath.   FLUoxetine (PROZAC) 20 MG tablet Take 1 tablet (20 mg total) by mouth daily.   hydrOXYzine (ATARAX) 25 MG tablet Take 1-2 tabs PO q6hrs PRN   LORazepam (ATIVAN) 0.5 MG tablet Take 1 tablet (0.5 mg total) by mouth 2 (two) times daily as needed for anxiety.   melatonin 5 MG TABS Take 5 mg by mouth at bedtime as needed (sleep).   metoprolol tartrate (LOPRESSOR) 100 MG tablet Take 1 tablet (100 mg total) by mouth as directed. Take 1 tablet 2 hours prior to CT Scan   [DISCONTINUED] albuterol (VENTOLIN HFA) 108 (90 Base) MCG/ACT inhaler Inhale 2 puffs into the lungs every 6 (six) hours as needed for wheezing or shortness of breath.   albuterol (VENTOLIN HFA) 108 (90 Base) MCG/ACT inhaler Inhale 2 puffs into the lungs every 6 (six) hours as needed for wheezing or shortness of breath.   No facility-administered encounter medications on file as of 08/05/2021.    Past Medical History:  Diagnosis Date   Asthma    Chest pain    Palpitations     No past surgical history on file.  Family History  Problem Relation Age of Onset   Healthy Mother    Heart attack Father     Social History   Socioeconomic History   Marital status: Married    Spouse name: Not on file   Number of children: Not on file   Years of education: Not on file   Highest education level: Not on file  Occupational History   Not on file  Tobacco Use   Smoking status: Former    Packs/day:  1.00    Years: 17.00    Total pack years: 17.00    Types: Cigarettes    Quit date: 12/09/2020    Years since quitting: 0.6   Smokeless tobacco: Current    Types: Snuff   Tobacco comments:    1/2 pack-pack per day  Vaping Use   Vaping Use: Never used  Substance and Sexual Activity   Alcohol use: Not Currently    Comment: rarely    Drug use: Not Currently   Sexual activity: Yes  Other Topics Concern   Not on file  Social History Narrative   Not on file   Social Determinants of Health   Financial Resource Strain: Not on file  Food Insecurity: Not on file  Transportation Needs: Not on file  Physical Activity: Not on file  Stress: Not on file  Social Connections: Not on file  Intimate Partner Violence: Not on file    Review of Systems  Respiratory:  Positive for cough and wheezing.   All other systems reviewed and are negative.       Objective    BP 135/79   Pulse 61   Temp 98.1 F (36.7 C) (Oral)   Resp 16   Ht 6\' 3"  (1.905 m)   Wt  297 lb (134.7 kg)   SpO2 95%   BMI 37.12 kg/m   Physical Exam Vitals and nursing note reviewed.  Constitutional:      General: He is not in acute distress.    Appearance: He is obese.  Cardiovascular:     Rate and Rhythm: Normal rate and regular rhythm.  Pulmonary:     Effort: Pulmonary effort is normal.     Breath sounds: Normal breath sounds.  Abdominal:     Palpations: Abdomen is soft.     Tenderness: There is no abdominal tenderness.  Neurological:     General: No focal deficit present.     Mental Status: He is alert and oriented to person, place, and time.         Assessment & Plan:   1. Mild intermittent reactive airway disease without complication Appears stable. Albuterol refilled  2. GAD (generalized anxiety disorder) Appears stable with present management. continue  3. Smoking Discussed reduction/cessation  4. Encounter to establish care     Return in about 3 months (around 11/05/2021) for  physical.   Tommie Raymond, MD

## 2021-08-08 ENCOUNTER — Telehealth (HOSPITAL_COMMUNITY): Payer: Self-pay | Admitting: *Deleted

## 2021-08-08 ENCOUNTER — Other Ambulatory Visit: Payer: Self-pay | Admitting: Physician Assistant

## 2021-08-08 DIAGNOSIS — F411 Generalized anxiety disorder: Secondary | ICD-10-CM

## 2021-08-08 NOTE — Telephone Encounter (Signed)
   Notes to clinic:  pharm request as follows:Pharmacy comment: REQUEST FOR 90 DAYS PRESCRIPTION.       Requested Prescriptions  Pending Prescriptions Disp Refills   FLUoxetine (PROZAC) 20 MG tablet [Pharmacy Med Name: FLUOXETINE HCL 20 MG TABLET] 90 tablet 1    Sig: TAKE 1 TABLET BY MOUTH EVERY DAY     There is no refill protocol information for this order

## 2021-08-08 NOTE — Telephone Encounter (Signed)
Reaching out to patient to offer assistance regarding upcoming cardiac imaging study; pt verbalizes understanding of appt date/time, parking situation and where to check in, pre-test NPO status and medications ordered, and verified current allergies; name and call back number provided for further questions should they arise  Larey Brick RN Navigator Cardiac Imaging [facility] [phone] office [phone] cell  Patient's HR fluctuates between the 40's-100's. Patient is able to check his HR two hours prior and will take 50mg  if HR is greater than 65bpm and 100mg  if HR is greater than 75bpm. He is aware to arrive a noon.

## 2021-08-11 ENCOUNTER — Ambulatory Visit (HOSPITAL_COMMUNITY): Admission: RE | Admit: 2021-08-11 | Payer: Self-pay | Source: Ambulatory Visit

## 2021-08-11 ENCOUNTER — Ambulatory Visit (HOSPITAL_COMMUNITY): Payer: Self-pay | Attending: Cardiology

## 2021-08-21 ENCOUNTER — Encounter (HOSPITAL_COMMUNITY): Payer: Self-pay | Admitting: Cardiology

## 2021-09-06 ENCOUNTER — Other Ambulatory Visit: Payer: Self-pay | Admitting: Physician Assistant

## 2021-09-06 DIAGNOSIS — F411 Generalized anxiety disorder: Secondary | ICD-10-CM

## 2021-09-07 NOTE — Telephone Encounter (Signed)
Fluoxetine refilled on 07/17/2021 for 90 day supply. Call patient to determine medical necessity of early refill request.

## 2021-10-03 ENCOUNTER — Telehealth (HOSPITAL_COMMUNITY): Payer: Self-pay | Admitting: Emergency Medicine

## 2021-10-03 NOTE — Telephone Encounter (Signed)
Attempted to call patient regarding upcoming cardiac CT appointment. °Left message on voicemail with name and callback number °Gershom Brobeck RN Navigator Cardiac Imaging °[facility] °[phone] Office °[phone] Cell ° °

## 2021-10-06 ENCOUNTER — Ambulatory Visit (HOSPITAL_COMMUNITY): Admission: RE | Admit: 2021-10-06 | Payer: Self-pay | Source: Ambulatory Visit

## 2021-10-08 ENCOUNTER — Other Ambulatory Visit: Payer: Self-pay | Admitting: Physician Assistant

## 2021-10-08 DIAGNOSIS — F411 Generalized anxiety disorder: Secondary | ICD-10-CM

## 2021-10-08 NOTE — Telephone Encounter (Signed)
Requested medications are due for refill today.  yes  Requested medications are on the active medications list.  yes  Last refill. 07/17/2021 #30 2 refills  Future visit scheduled.   no  Notes to clinic.  Pharmacy comment: REQUEST FOR 90 DAYS PRESCRIPTION. DX Code Needed.    Requested Prescriptions  Pending Prescriptions Disp Refills   FLUoxetine (PROZAC) 20 MG tablet [Pharmacy Med Name: FLUOXETINE HCL 20 MG TABLET] 90 tablet 1    Sig: TAKE 1 TABLET BY MOUTH EVERY DAY     There is no refill protocol information for this order

## 2021-10-15 ENCOUNTER — Encounter (HOSPITAL_COMMUNITY): Payer: Self-pay | Admitting: *Deleted

## 2021-10-15 ENCOUNTER — Other Ambulatory Visit: Payer: Self-pay

## 2021-10-15 ENCOUNTER — Emergency Department (HOSPITAL_COMMUNITY)
Admission: EM | Admit: 2021-10-15 | Discharge: 2021-10-15 | Disposition: A | Discharge status: Home / Self Care | Payer: Self-pay | Attending: Emergency Medicine | Admitting: Emergency Medicine

## 2021-10-15 DIAGNOSIS — Z7951 Long term (current) use of inhaled steroids: Secondary | ICD-10-CM | POA: Insufficient documentation

## 2021-10-15 DIAGNOSIS — Z20822 Contact with and (suspected) exposure to covid-19: Secondary | ICD-10-CM | POA: Insufficient documentation

## 2021-10-15 DIAGNOSIS — R197 Diarrhea, unspecified: Secondary | ICD-10-CM | POA: Insufficient documentation

## 2021-10-15 HISTORY — DX: Anxiety disorder, unspecified: F41.9

## 2021-10-15 LAB — SARS CORONAVIRUS 2 BY RT PCR: SARS Coronavirus 2 by RT PCR: NEGATIVE

## 2021-10-15 NOTE — ED Triage Notes (Signed)
Exposed to Covid 2  days ago, requesting test

## 2021-10-15 NOTE — ED Provider Notes (Signed)
[facility]-EMERGENCY DEPT Provider Note   CSN: [id number] Arrival date & time: 10/15/21  1314     History  Chief Complaint  Patient presents with   Covid Exposure    Stephen Burnett is a 26 y.o. male who presents emergency department requesting COVID testing.  He had an exposure 4 days ago.  He and his fiance also both began having symptoms of nausea and some diarrhea starting Monday.  They were unsure if this related to COVID or because they both ordered a similar item off the [employer] menu causing possible food poisoning.  Patient states he is actually feeling better today, however they took a home COVID test and realized that their test were a year expired.  Currently denies cough, congestion, fevers, chills, stomach pain and vomiting.  HPI     Home Medications Prior to Admission medications   Medication Sig Start Date End Date Taking? Authorizing Provider  albuterol (PROVENTIL) (2.5 MG/3ML) 0.083% nebulizer solution Take 3 mLs (2.5 mg total) by nebulization every 6 (six) hours as needed for wheezing or shortness of breath. 06/13/20   Parrett, Virgel Bouquet, NP  albuterol (VENTOLIN HFA) 108 (90 Base) MCG/ACT inhaler Inhale 2 puffs into the lungs every 6 (six) hours as needed for wheezing or shortness of breath. 08/05/21   Georganna Skeans, MD  FLUoxetine (PROZAC) 20 MG tablet TAKE 1 TABLET BY MOUTH EVERY DAY 10/09/21   Georganna Skeans, MD  hydrOXYzine (ATARAX) 25 MG tablet Take 1-2 tabs PO q6hrs PRN 07/17/21   Mayers, Cari S, PA-C  LORazepam (ATIVAN) 0.5 MG tablet Take 1 tablet (0.5 mg total) by mouth 2 (two) times daily as needed for anxiety. 07/29/21   Mayers, Cari S, PA-C  melatonin 5 MG TABS Take 5 mg by mouth at bedtime as needed (sleep).    [provider]  metoprolol tartrate (LOPRESSOR) 100 MG tablet Take 1 tablet (100 mg total) by mouth as directed. Take 1 tablet 2 hours prior to CT Scan 07/21/21   Quintella Reichert, MD      Allergies    Banana    Review of  Systems   Review of Systems  Constitutional:  Negative for chills and fever.  HENT:  Negative for congestion.   Respiratory:  Negative for cough.   Gastrointestinal:  Positive for diarrhea and nausea. Negative for abdominal pain.    Physical Exam Updated Vital Signs BP (!) 140/77   Pulse (!) 57   Temp 98.4 F (36.9 C) (Oral)   Resp 18   Ht 6\' 3"  (1.905 m)   SpO2 95%   BMI 37.12 kg/m  Physical Exam Vitals and nursing note reviewed.  Constitutional:      General: He is not in acute distress.    Appearance: He is not ill-appearing.  HENT:     Head: Atraumatic.  Eyes:     Conjunctiva/sclera: Conjunctivae normal.  Cardiovascular:     Rate and Rhythm: Normal rate and regular rhythm.     Pulses: Normal pulses.     Heart sounds: No murmur heard. Pulmonary:     Effort: Pulmonary effort is normal. No respiratory distress.     Breath sounds: Normal breath sounds.  Abdominal:     General: Abdomen is flat. There is no distension.     Palpations: Abdomen is soft.     Tenderness: There is no abdominal tenderness.  Musculoskeletal:        General: Normal range of motion.     Cervical back:  Normal range of motion.  Skin:    General: Skin is warm and dry.     Capillary Refill: Capillary refill takes less than 2 seconds.  Neurological:     General: No focal deficit present.     Mental Status: He is alert.  Psychiatric:        Mood and Affect: Mood normal.     ED Results / Procedures / Treatments   Labs (all labs ordered are listed, but only abnormal results are displayed) Labs Reviewed  SARS CORONAVIRUS 2 BY RT PCR    EKG None  Radiology No results found.  Procedures Procedures    Medications Ordered in ED Medications - No data to display  ED Course/ Medical Decision Making/ A&P                           Medical Decision Making  26 year old male presents emergency department requesting COVID testing for nausea and diarrhea after a COVID exposure 4 days ago.   Vitals are without significant abnormality.  On exam, patient is well-appearing and in no acute distress.  Physical exam was overall benign.  I ordered COVID test which was ultimately negative.  Advised symptoms are more consistent with food poisoning given that his fiance has similar symptoms after both eating [employer].  Advised increase fluid intake.  Patient was discharged home in stable condition.  Final Clinical Impression(s) / ED Diagnoses Final diagnoses:  Close exposure to COVID-19 virus  Diarrhea, unspecified type    Rx / DC Orders ED Discharge Orders     None         Janell Quiet, PA-C 10/15/21 1503    Gerhard Munch, MD 10/15/21 1657

## 2021-10-15 NOTE — Discharge Instructions (Signed)
Your COVID test today was negative.  Given that you and your fianc are having similar symptoms of nausea and diarrhea after eating [employer] the other day, it is most likely related to food poisoning.  Continue to drink plenty of and feel better soon.  Please note that if you were exposed to COVID on Sunday, you still may end up developing symptoms.

## 2021-12-01 ENCOUNTER — Ambulatory Visit: Payer: Self-pay

## 2021-12-01 ENCOUNTER — Ambulatory Visit
Admission: EM | Admit: 2021-12-01 | Discharge: 2021-12-01 | Disposition: A | Discharge status: Home / Self Care | Payer: Self-pay | Attending: Urgent Care | Admitting: Urgent Care

## 2021-12-01 DIAGNOSIS — R112 Nausea with vomiting, unspecified: Secondary | ICD-10-CM

## 2021-12-01 DIAGNOSIS — R6889 Other general symptoms and signs: Secondary | ICD-10-CM

## 2021-12-01 MED ORDER — ONDANSETRON 4 MG PO TBDP
4.0000 mg | ORAL_TABLET | Freq: Once | ORAL | Status: AC
  Administered 2021-12-01: 4 mg via ORAL

## 2021-12-01 MED ORDER — ONDANSETRON HCL 4 MG PO TABS: 4.0000 mg | ORAL_TABLET | Freq: Three times a day (TID) | ORAL | 0 refills | Status: AC | PRN

## 2021-12-01 NOTE — Telephone Encounter (Signed)
Patient is going to UC. Patient will call back if this appt do not work out for him

## 2021-12-01 NOTE — ED Provider Notes (Signed)
[facility]    CSN: [id number] Arrival date & time: 12/01/21  1221      History   Chief Complaint Chief Complaint  Patient presents with   Nausea   Abdominal Pain   Diarrhea    HPI Stephen Burnett is a 26 y.o. male.    Abdominal Pain Associated symptoms: diarrhea   Diarrhea Associated symptoms: abdominal pain     Presents to UC with complaint of abdominal pain, nausea, diarrhea since Saturday.  Taking OTC medication without relief.  Patient endorses increased drinking alcohol starting Friday night following taking anxiety medication and concerned about possible interaction.  He also complains of chills, body aches.  Past Medical History:  Diagnosis Date   Anxiety    Asthma    Chest pain    Palpitations     Patient Active Problem List   Diagnosis Date Noted   GAD (generalized anxiety disorder) 07/17/2021   Panic disorder 07/17/2021   Abnormal thyroid blood test 07/17/2021   Class 2 obesity due to excess calories without serious comorbidity with body mass index (BMI) of 36.0 to 36.9 in adult 07/17/2021   Allergic rhinitis 06/13/2020   Smoking 06/13/2020   Asthma 06/22/2019    History reviewed. No pertinent surgical history.     Home Medications    Prior to Admission medications   Medication Sig Start Date End Date Taking? Authorizing Provider  albuterol (PROVENTIL) (2.5 MG/3ML) 0.083% nebulizer solution Take 3 mLs (2.5 mg total) by nebulization every 6 (six) hours as needed for wheezing or shortness of breath. 06/13/20   Parrett, Virgel Bouquet, NP  albuterol (VENTOLIN HFA) 108 (90 Base) MCG/ACT inhaler Inhale 2 puffs into the lungs every 6 (six) hours as needed for wheezing or shortness of breath. 08/05/21   Georganna Skeans, MD  FLUoxetine (PROZAC) 20 MG tablet TAKE 1 TABLET BY MOUTH EVERY DAY 10/09/21   Georganna Skeans, MD  hydrOXYzine (ATARAX) 25 MG tablet Take 1-2 tabs PO q6hrs PRN 07/17/21   Mayers, Cari S, PA-C  LORazepam (ATIVAN) 0.5 MG tablet Take 1  tablet (0.5 mg total) by mouth 2 (two) times daily as needed for anxiety. 07/29/21   Mayers, Cari S, PA-C  melatonin 5 MG TABS Take 5 mg by mouth at bedtime as needed (sleep).    [provider]  metoprolol tartrate (LOPRESSOR) 100 MG tablet Take 1 tablet (100 mg total) by mouth as directed. Take 1 tablet 2 hours prior to CT Scan 07/21/21   Quintella Reichert, MD    Family History Family History  Problem Relation Age of Onset   Healthy Mother    Heart attack Father     Social History Social History   Tobacco Use   Smoking status: Former    Packs/day: 1.00    Years: 17.00    Total pack years: 17.00    Types: Cigarettes    Quit date: 12/09/2020    Years since quitting: 0.9   Smokeless tobacco: Current    Types: Snuff   Tobacco comments:    1/2 pack-pack per day  Vaping Use   Vaping Use: Every day  Substance Use Topics   Alcohol use: Not Currently    Comment: rarely    Drug use: Not Currently     Allergies   Banana   Review of Systems Review of Systems  Gastrointestinal:  Positive for abdominal pain and diarrhea.     Physical Exam Triage Vital Signs ED Triage Vitals  Enc Vitals Group  BP 12/01/21 1308 (!) 152/80     Pulse Rate 12/01/21 1308 74     Resp 12/01/21 1308 16     Temp 12/01/21 1308 98 F (36.7 C)     Temp src --      SpO2 12/01/21 1308 98 %     Weight --      Height --      Head Circumference --      Peak Flow --      Pain Score 12/01/21 1309 9     Pain Loc --      Pain Edu? --      Excl. in GC? --    No data found.  Updated Vital Signs BP (!) 152/80   Pulse 74   Temp 98 F (36.7 C)   Resp 16   SpO2 98%   Visual Acuity Right Eye Distance:   Left Eye Distance:   Bilateral Distance:    Right Eye Near:   Left Eye Near:    Bilateral Near:     Physical Exam Vitals reviewed.  Constitutional:      Appearance: He is well-developed.  HENT:     Mouth/Throat:     Mouth: Mucous membranes are moist.     Pharynx: Oropharynx  is clear.     Tonsils: 3+ on the right. 1+ on the left.  Cardiovascular:     Rate and Rhythm: Normal rate and regular rhythm.  Pulmonary:     Effort: Pulmonary effort is normal.     Breath sounds: Normal breath sounds.  Abdominal:     General: Bowel sounds are increased.  Skin:    General: Skin is warm and dry.  Neurological:     General: No focal deficit present.     Mental Status: He is alert and oriented to person, place, and time.  Psychiatric:        Mood and Affect: Mood normal.        Behavior: Behavior normal.      UC Treatments / Results  Labs (all labs ordered are listed, but only abnormal results are displayed) Labs Reviewed - No data to display  EKG   Radiology No results found.  Procedures Procedures (including critical care time)  Medications Ordered in UC Medications - No data to display  Initial Impression / Assessment and Plan / UC Course  I have reviewed the triage vital signs and the nursing notes.  Pertinent labs & imaging results that were available during my care of the patient were reviewed by me and considered in my medical decision making (see chart for details).   Suspect viral etiology for his flulike symptoms.  While it is possible that he is encountering some interaction between the lorazepam and alcohol, I do not think this is likely.  His wife confirms that he has not drank much alcohol (if any) since starting on Prozac.  She says they went out with friends on Friday night and he drank a little.  Does not take any other medications that might produce a reaction like this with alcohol.  Lungs CTAB.  Right tonsil is 3+.  Unsure of his baseline.  There is some pharyngeal erythema without peritonsillar exudate.  Will perform nasal swab to test for influenza/COVID/RSV.  Recommending focus on hydration with small sips of fluids as tolerated.  Avoid fatty/fried/spicy foods.  Advance diet as tolerated.  Administered Zofran ODT in clinic to  assess for tolerance and effectiveness.  We will discharge with prescription  to promote hydration.   Final Clinical Impressions(s) / UC Diagnoses   Final diagnoses:  None   Discharge Instructions   None    ED Prescriptions   None    PDMP not reviewed this encounter.   Rose Phi, [place] 12/01/21 1347

## 2021-12-01 NOTE — Discharge Instructions (Addendum)
Follow up here or with your primary care provider if your symptoms are worsening or not improving.     

## 2021-12-01 NOTE — ED Triage Notes (Signed)
Pt. Presents to UC w/ c/o abdominal pain, nausea and diarrhea since Saturday. Pt. Has taken OTC medication and no relief. Pt. Also states he started back drinking Friday night after taking his anxiety medication and thinks maybe mixing the two could of upset his stomach.

## 2021-12-01 NOTE — Telephone Encounter (Signed)
  Chief Complaint: Vomiting diarrhea, stomach pain fever Symptoms: as above Frequency: Saturday night Pertinent Negatives: Patient denies  Disposition: [x] ED /[] Urgent Care (no appt availability in office) / [] Appointment(In office/virtual)/ []  [facility]/ [] Home Care/ [] Refused Recommended Disposition /[] [facility]/ []  Follow-up with PCP Additional Notes: PT has had vomiting , diarrhea, fever and abdominal pain since Saturday night.     Reason for Disposition  [1] Vomiting AND [2] contains bile (green color)  Answer Assessment - Initial Assessment Questions 1. LOCATION: "Where does it hurt?"      Stomach 2. RADIATION: "Does the pain shoot anywhere else?" (e.g., chest, back)     Into chest 3. ONSET: "When did the pain begin?" (Minutes, hours or days ago)      Saturday 4. SUDDEN: "Gradual or sudden onset?"     sudden 5. PATTERN "Does the pain come and go, or is it constant?"    - If it comes and goes: "How long does it last?" "Do you have pain now?"     (Note: Comes and goes means the pain is intermittent. It goes away completely between bouts.)    - If constant: "Is it getting better, staying the same, or getting worse?"      (Note: Constant means the pain never goes away completely; most serious pain is constant and gets worse.)      constant 6. SEVERITY: "How bad is the pain?"  (e.g., Scale 1-10; mild, moderate, or severe)    - MILD (1-3): Doesn't interfere with normal activities, abdomen soft and not tender to touch.     - MODERATE (4-7): Interferes with normal activities or awakens from sleep, abdomen tender to touch.     - SEVERE (8-10): Excruciating pain, doubled over, unable to do any normal activities.       8/10 7. RECURRENT SYMPTOM: "Have you ever had this type of stomach pain before?" If Yes, ask: "When was the last time?" and "What happened that time?"      Yes - when he had pneumonia 8. CAUSE: "What do you think is causing the stomach pain?"      unsure 9. RELIEVING/AGGRAVATING FACTORS: "What makes it better or worse?" (e.g., antacids, bending or twisting motion, bowel movement)     no 10. OTHER SYMPTOMS: "Do you have any other symptoms?" (e.g., back pain, diarrhea, fever, urination pain, vomiting)       Nausea, vomiting, diarrhea, fever.  Protocols used: Abdominal Pain - Male-A-AH

## 2021-12-03 ENCOUNTER — Telehealth: Payer: Self-pay | Admitting: Family Medicine

## 2021-12-03 NOTE — Telephone Encounter (Signed)
Patient was called and given the suggestion of pcp. Provider suggest if patient is still not able to keep food down,N/V to go back to ER.  Patient said he just woke up and feeling alright as of now. Patient will call back or go to ER if it gets worst

## 2021-12-03 NOTE — Telephone Encounter (Signed)
Copied from [place] [phone]. Topic: General - Other >> Dec 03, 2021  9:23 AM Chapman Fitch wrote: Reason for CRM: Pt would like a call to go lab results he had done this morning / he doesn't understand them/ please advise asap

## 2021-12-07 ENCOUNTER — Other Ambulatory Visit: Payer: Self-pay | Admitting: Family Medicine

## 2021-12-07 DIAGNOSIS — F411 Generalized anxiety disorder: Secondary | ICD-10-CM

## 2022-01-08 ENCOUNTER — Other Ambulatory Visit: Payer: Self-pay | Admitting: Family Medicine

## 2022-01-08 DIAGNOSIS — F411 Generalized anxiety disorder: Secondary | ICD-10-CM

## 2022-01-08 NOTE — Telephone Encounter (Signed)
Requested medication (s) are due for refill today:   No  Requested medication (s) are on the active medication list:   Yes  Future visit scheduled:   No   Last ordered: 10/09/2021 #90, 1 refill  Returned because a 90 day supply and a DX Code are being requested.    Requested Prescriptions  Pending Prescriptions Disp Refills   FLUoxetine (PROZAC) 20 MG tablet [Pharmacy Med Name: FLUOXETINE HCL 20 MG TABLET] 90 tablet 2    Sig: TAKE 1 TABLET BY MOUTH EVERY DAY     There is no refill protocol information for this order

## 2022-04-12 ENCOUNTER — Other Ambulatory Visit: Payer: Self-pay | Admitting: Family Medicine

## 2022-04-12 DIAGNOSIS — F411 Generalized anxiety disorder: Secondary | ICD-10-CM

## 2022-07-08 ENCOUNTER — Other Ambulatory Visit: Payer: Self-pay | Admitting: Family Medicine

## 2022-07-08 DIAGNOSIS — F411 Generalized anxiety disorder: Secondary | ICD-10-CM

## 2022-08-05 ENCOUNTER — Other Ambulatory Visit: Payer: Self-pay | Admitting: Family Medicine

## 2022-08-05 DIAGNOSIS — F411 Generalized anxiety disorder: Secondary | ICD-10-CM

## 2022-08-12 ENCOUNTER — Other Ambulatory Visit: Payer: Self-pay | Admitting: Family Medicine

## 2022-08-12 DIAGNOSIS — F411 Generalized anxiety disorder: Secondary | ICD-10-CM

## 2022-08-12 MED ORDER — FLUOXETINE HCL 20 MG PO TABS
20.0000 mg | ORAL_TABLET | Freq: Every day | ORAL | 0 refills | Status: DC
Start: 2022-08-12 — End: 2022-08-18

## 2022-08-12 NOTE — Telephone Encounter (Signed)
Pt. Has appointment. Requested Prescriptions  Pending Prescriptions Disp Refills   FLUoxetine (PROZAC) 20 MG tablet 30 tablet 0    Sig: Take 1 tablet (20 mg total) by mouth daily.     Psychiatry:  Antidepressants - SSRI Failed - 08/12/2022 10:27 AM      Failed - Valid encounter within last 6 months    Recent Outpatient Visits           1 year ago Mild intermittent reactive airway disease without complication   [facility], MD   3 years ago Annual physical exam   [facility], Kandee Keen, MD   3 years ago Encounter to establish care   [facility] at [facility], Kandee Keen, MD       Future Appointments             In 6 days Georganna Skeans, MD [facility] at [facility]

## 2022-08-12 NOTE — Telephone Encounter (Signed)
Patient request courtesy refill.

## 2022-08-12 NOTE — Telephone Encounter (Signed)
Medication Refill - Medication: FLUoxetine (PROZAC) 20 MG tablet   Pt asked if it was possible to get a courtesy refill to get him through until his appointment. Stated only has one tablet left.   Please advise.    Has the patient contacted their pharmacy? Yes.   No, more refills.   (Agent: If yes, when and what did the pharmacy advise?)  Preferred Pharmacy (with phone number or street name):  [id number] - [place], [place] - [address] AT [facility]  [address] [place] [address]  Phone: [phone] Fax: [phone]  Hours: Not open 24 hours   Has the patient been seen for an appointment in the last year OR does the patient have an upcoming appointment? Yes.   08/17/2022  Agent: Please be advised that RX refills may take up to 3 business days. We ask that you follow-up with your pharmacy.

## 2022-08-14 NOTE — Telephone Encounter (Signed)
Fluoxetine prescribed 08/12/2022 by Georganna Skeans, MD.

## 2022-08-18 ENCOUNTER — Encounter: Payer: Self-pay | Admitting: Family Medicine

## 2022-08-18 ENCOUNTER — Ambulatory Visit (INDEPENDENT_AMBULATORY_CARE_PROVIDER_SITE_OTHER): Payer: Commercial Managed Care - PPO | Admitting: Family Medicine

## 2022-08-18 VITALS — BP 128/77 | HR 65 | Temp 98.1°F | Resp 16 | Wt 297.0 lb

## 2022-08-18 DIAGNOSIS — Z87891 Personal history of nicotine dependence: Secondary | ICD-10-CM

## 2022-08-18 DIAGNOSIS — J4520 Mild intermittent asthma, uncomplicated: Secondary | ICD-10-CM

## 2022-08-18 DIAGNOSIS — F411 Generalized anxiety disorder: Secondary | ICD-10-CM | POA: Diagnosis not present

## 2022-08-18 MED ORDER — ALBUTEROL SULFATE HFA 108 (90 BASE) MCG/ACT IN AERS: 2.0000 | INHALATION_SPRAY | Freq: Four times a day (QID) | RESPIRATORY_TRACT | 3 refills | Status: AC | PRN

## 2022-08-18 MED ORDER — FLUOXETINE HCL 20 MG PO TABS: 20.0000 mg | ORAL_TABLET | Freq: Every day | ORAL | 1 refills | Status: DC

## 2022-08-18 NOTE — Telephone Encounter (Signed)
Complete

## 2022-08-18 NOTE — Progress Notes (Unsigned)
Established Patient Office Visit  Subjective    Patient ID: Stephen Burnett, male    DOB: 1995-02-22  Age: 26 y.o. MRN: [id number]  CC:  Chief Complaint  Patient presents with   Medication Refill    HPI Stephen Burnett presents for routine follow up of chronic med issues. Patient denies acute complaints or concerns.    Outpatient Encounter Medications as of 08/18/2022  Medication Sig   albuterol (PROVENTIL) (2.5 MG/3ML) 0.083% nebulizer solution Take 3 mLs (2.5 mg total) by nebulization every 6 (six) hours as needed for wheezing or shortness of breath.   EPINEPHrine 0.3 mg/0.3 mL IJ SOAJ injection Inject into the muscle.   LORazepam (ATIVAN) 0.5 MG tablet Take 1 tablet (0.5 mg total) by mouth 2 (two) times daily as needed for anxiety.   melatonin 5 MG TABS Take 5 mg by mouth at bedtime as needed (sleep).   metoprolol tartrate (LOPRESSOR) 100 MG tablet Take 1 tablet (100 mg total) by mouth as directed. Take 1 tablet 2 hours prior to CT Scan   [DISCONTINUED] albuterol (VENTOLIN HFA) 108 (90 Base) MCG/ACT inhaler Inhale 2 puffs into the lungs every 6 (six) hours as needed for wheezing or shortness of breath.   [DISCONTINUED] FLUoxetine (PROZAC) 20 MG tablet Take 1 tablet (20 mg total) by mouth daily.   albuterol (VENTOLIN HFA) 108 (90 Base) MCG/ACT inhaler Inhale 2 puffs into the lungs every 6 (six) hours as needed for wheezing or shortness of breath.   FLUoxetine (PROZAC) 20 MG tablet Take 1 tablet (20 mg total) by mouth daily.   [DISCONTINUED] hydrOXYzine (ATARAX) 25 MG tablet Take 1-2 tabs PO q6hrs PRN   No facility-administered encounter medications on file as of 08/18/2022.    Past Medical History:  Diagnosis Date   Anxiety    Asthma    Chest pain    Palpitations     No past surgical history on file.  Family History  Problem Relation Age of Onset   Healthy Mother    Heart attack Father     Social History   Socioeconomic History   Marital status: Married    Spouse  name: Not on file   Number of children: Not on file   Years of education: Not on file   Highest education level: Not on file  Occupational History   Not on file  Tobacco Use   Smoking status: Former    Packs/day: 1.00    Years: 17.00    Additional pack years: 0.00    Total pack years: 17.00    Types: Cigarettes    Quit date: 12/09/2020    Years since quitting: 1.6   Smokeless tobacco: Current    Types: Snuff   Tobacco comments:    1/2 pack-pack per day  Vaping Use   Vaping Use: Every day  Substance and Sexual Activity   Alcohol use: Not Currently    Comment: rarely    Drug use: Not Currently   Sexual activity: Yes  Other Topics Concern   Not on file  Social History Narrative   Not on file   Social Determinants of Health   Financial Resource Strain: Not on file  Food Insecurity: Not on file  Transportation Needs: Not on file  Physical Activity: Not on file  Stress: Not on file  Social Connections: Not on file  Intimate Partner Violence: Not on file    Review of Systems  All other systems reviewed and are negative.  Objective    BP 128/77   Pulse 65   Temp 98.1 F (36.7 C) (Oral)   Resp 16   Wt 297 lb (134.7 kg)   SpO2 95%   BMI 37.12 kg/m   Physical Exam Vitals and nursing note reviewed.  Constitutional:      General: He is not in acute distress.    Appearance: He is obese.  Cardiovascular:     Rate and Rhythm: Normal rate and regular rhythm.  Pulmonary:     Effort: Pulmonary effort is normal.     Breath sounds: Normal breath sounds.  Neurological:     General: No focal deficit present.     Mental Status: He is alert and oriented to person, place, and time.     {Labs (Optional):23779}    Assessment & Plan:      No follow-ups on file.   Tommie Raymond, MD

## 2022-08-18 NOTE — Progress Notes (Unsigned)
Patient is here for their 1 month follow-up Patient is her for 3 month refill on medication  Care gaps have been discussed with patient

## 2022-08-19 ENCOUNTER — Encounter: Payer: Self-pay | Admitting: Family Medicine

## 2023-02-18 ENCOUNTER — Ambulatory Visit (INDEPENDENT_AMBULATORY_CARE_PROVIDER_SITE_OTHER): Payer: Self-pay | Admitting: Family Medicine

## 2023-02-18 ENCOUNTER — Encounter: Payer: Self-pay | Admitting: Family Medicine

## 2023-02-18 VITALS — BP 129/76 | HR 65 | Temp 97.5°F | Resp 16 | Ht 75.0 in | Wt 282.2 lb

## 2023-02-18 DIAGNOSIS — J4520 Mild intermittent asthma, uncomplicated: Secondary | ICD-10-CM

## 2023-02-18 DIAGNOSIS — Z720 Tobacco use: Secondary | ICD-10-CM

## 2023-02-18 DIAGNOSIS — F411 Generalized anxiety disorder: Secondary | ICD-10-CM

## 2023-02-18 DIAGNOSIS — G4726 Circadian rhythm sleep disorder, shift work type: Secondary | ICD-10-CM

## 2023-02-18 MED ORDER — FLUOXETINE HCL 20 MG PO TABS: 20.0000 mg | ORAL_TABLET | Freq: Every day | ORAL | 1 refills | Status: DC

## 2023-02-18 NOTE — Progress Notes (Signed)
 Established Patient Office Visit  Subjective    Patient ID: Stephen Burnett, male    DOB: 1995/03/06  Age: 28 y.o. MRN: [id number]  CC:  Chief Complaint  Patient presents with   Medical Management of Chronic Issues    HPI Stephen Burnett presents for follow up of anxiety. He reports that he is doing well with present dosage. He reports some difficulty sleeping as he now works third shift.  Outpatient Encounter Medications as of 02/18/2023  Medication Sig   albuterol  (PROVENTIL ) (2.5 MG/3ML) 0.083% nebulizer solution Take 3 mLs (2.5 mg total) by nebulization every 6 (six) hours as needed for wheezing or shortness of breath.   albuterol  (VENTOLIN  HFA) 108 (90 Base) MCG/ACT inhaler Inhale 2 puffs into the lungs every 6 (six) hours as needed for wheezing or shortness of breath.   EPINEPHrine  0.3 mg/0.3 mL IJ SOAJ injection Inject into the muscle.   LORazepam  (ATIVAN ) 0.5 MG tablet Take 1 tablet (0.5 mg total) by mouth 2 (two) times daily as needed for anxiety.   melatonin 5 MG TABS Take 5 mg by mouth at bedtime as needed (sleep).   metoprolol  tartrate (LOPRESSOR ) 100 MG tablet Take 1 tablet (100 mg total) by mouth as directed. Take 1 tablet 2 hours prior to CT Scan   [DISCONTINUED] FLUoxetine  (PROZAC ) 20 MG tablet Take 1 tablet (20 mg total) by mouth daily.   FLUoxetine  (PROZAC ) 20 MG tablet Take 1 tablet (20 mg total) by mouth daily.   No facility-administered encounter medications on file as of 02/18/2023.    Past Medical History:  Diagnosis Date   Anxiety    Asthma    Chest pain    Palpitations     No past surgical history on file.  Family History  Problem Relation Age of Onset   Healthy Mother    Heart attack Father     Social History   Socioeconomic History   Marital status: Married    Spouse name: Not on file   Number of children: Not on file   Years of education: Not on file   Highest education level: Not on file  Occupational History   Not on file  Tobacco Use    Smoking status: Former    Current packs/day: 0.00    Average packs/day: 1 pack/day for 17.0 years (17.0 ttl pk-yrs)    Types: Cigarettes    Start date: 12/10/2003    Quit date: 12/09/2020    Years since quitting: 2.1   Smokeless tobacco: Current    Types: Snuff   Tobacco comments:    1/2 pack-pack per day  Vaping Use   Vaping status: Every Day  Substance and Sexual Activity   Alcohol use: Not Currently    Comment: rarely    Drug use: Not Currently   Sexual activity: Yes  Other Topics Concern   Not on file  Social History Narrative   Not on file   Social Drivers of Health   Financial Resource Strain: Low Risk  (02/18/2023)   Overall Financial Resource Strain (CARDIA)    Difficulty of Paying Living Expenses: Not very hard  Food Insecurity: No Food Insecurity (02/18/2023)   Hunger Vital Sign    Worried About Running Out of Food in the Last Year: Never true    Ran Out of Food in the Last Year: Never true  Transportation Needs: No Transportation Needs (02/18/2023)   PRAPARE - Transportation    Lack of Transportation (Medical): No    Lack of  Transportation (Non-Medical): No  Physical Activity: Sufficiently Active (02/18/2023)   Exercise Vital Sign    Days of Exercise per Week: 5 days    Minutes of Exercise per Session: 30 min  Stress: No Stress Concern Present (02/18/2023)   [employer] of Occupational Health - Occupational Stress Questionnaire    Feeling of Stress : Not at all  Social Connections: Moderately Integrated (02/18/2023)   Social Connection and Isolation Panel [NHANES]    Frequency of Communication with Friends and Family: More than three times a week    Frequency of Social Gatherings with Friends and Family: Three times a week    Attends Religious Services: 1 to 4 times per year    Active Member of Clubs or Organizations: No    Attends Banker Meetings: Never    Marital Status: Married  Catering Manager Violence: Not At Risk (02/18/2023)    Humiliation, Afraid, Rape, and Kick questionnaire    Fear of Current or Ex-Partner: No    Emotionally Abused: No    Physically Abused: No    Sexually Abused: No    Review of Systems  All other systems reviewed and are negative.       Objective    BP 129/76   Pulse 65   Temp (!) 97.5 F (36.4 C) (Oral)   Resp 16   Ht 6' 3 (1.905 m)   Wt 282 lb 3.2 oz (128 kg)   SpO2 96%   BMI 35.27 kg/m   Physical Exam Vitals and nursing note reviewed.  Constitutional:      General: He is not in acute distress.    Appearance: He is obese.  Cardiovascular:     Rate and Rhythm: Normal rate and regular rhythm.  Pulmonary:     Effort: Pulmonary effort is normal.     Breath sounds: Normal breath sounds.  Neurological:     General: No focal deficit present.     Mental Status: He is alert and oriented to person, place, and time.  Psychiatric:        Mood and Affect: Mood normal.        Behavior: Behavior normal.         Assessment & Plan:   1. GAD (generalized anxiety disorder) (Primary) Appears stable. Continue. Meds refilled.  - FLUoxetine  (PROZAC ) 20 MG tablet; Take 1 tablet (20 mg total) by mouth daily.  Dispense: 90 tablet; Refill: 1  2. Mild intermittent reactive airway disease without complication Appears stable.   3. Shift work sleep disorder Discussed alternative sleep hygiene.   4. Vapes nicotine containing substance Discussed cessation/reduction     Return in about 6 months (around 08/18/2023) for follow up.   Tanda Raguel SQUIBB, MD

## 2023-02-18 NOTE — Progress Notes (Signed)
 Patient is here for their 6 month follow-up Patient has no concerns today Care gaps have been discussed with patient

## 2023-06-17 ENCOUNTER — Emergency Department

## 2023-06-17 ENCOUNTER — Emergency Department
Admission: EM | Admit: 2023-06-17 | Discharge: 2023-06-17 | Disposition: A | Discharge status: Home / Self Care | Attending: Emergency Medicine | Admitting: Emergency Medicine

## 2023-06-17 ENCOUNTER — Other Ambulatory Visit: Payer: Self-pay

## 2023-06-17 DIAGNOSIS — K219 Gastro-esophageal reflux disease without esophagitis: Secondary | ICD-10-CM | POA: Diagnosis not present

## 2023-06-17 DIAGNOSIS — R0789 Other chest pain: Secondary | ICD-10-CM | POA: Diagnosis present

## 2023-06-17 LAB — BASIC METABOLIC PANEL WITH GFR
Anion gap: 13 (ref 5–15)
BUN: 10 mg/dL (ref 6–20)
CO2: 25 mmol/L (ref 22–32)
Calcium: 9.6 mg/dL (ref 8.9–10.3)
Chloride: 101 mmol/L (ref 98–111)
Creatinine, Ser: 1.05 mg/dL (ref 0.61–1.24)
GFR, Estimated: >60 mL/min (ref >60)
Glucose, Bld: 114 mg/dL — ABNORMAL HIGH (ref 70–99)
Potassium: 4.2 mmol/L (ref 3.5–5.1)
Sodium: 139 mmol/L (ref 135–145)

## 2023-06-17 LAB — HEPATIC FUNCTION PANEL
ALT: 195 U/L — ABNORMAL HIGH (ref 0–44)
AST: 117 U/L — ABNORMAL HIGH (ref 15–41)
Albumin: 4.4 g/dL (ref 3.5–5.0)
Alkaline Phosphatase: 57 U/L (ref 38–126)
Bilirubin, Direct: <0.1 mg/dL (ref 0.0–0.2)
Total Bilirubin: 0.7 mg/dL (ref 0.0–1.2)
Total Protein: 8.5 g/dL — ABNORMAL HIGH (ref 6.5–8.1)

## 2023-06-17 LAB — RESP PANEL BY RT-PCR (RSV, FLU A&B, COVID)  RVPGX2
Influenza A by PCR: NEGATIVE
Influenza B by PCR: NEGATIVE
Resp Syncytial Virus by PCR: NEGATIVE
SARS Coronavirus 2 by RT PCR: NEGATIVE

## 2023-06-17 LAB — CBC
HCT: 43.4 % (ref 39.0–52.0)
Hemoglobin: 15.3 g/dL (ref 13.0–17.0)
MCH: 32.3 pg (ref 26.0–34.0)
MCHC: 35.3 g/dL (ref 30.0–36.0)
MCV: 91.8 fL (ref 80.0–100.0)
Platelets: 200 10*3/uL (ref 150–400)
RBC: 4.73 MIL/uL (ref 4.22–5.81)
RDW: 13.1 % (ref 11.5–15.5)
WBC: 8.1 10*3/uL (ref 4.0–10.5)
nRBC: 0 % (ref 0.0–0.2)

## 2023-06-17 LAB — LIPASE, BLOOD: Lipase: 39 U/L (ref 11–51)

## 2023-06-17 LAB — TROPONIN I (HIGH SENSITIVITY): Troponin I (High Sensitivity): 3 ng/L (ref <18)

## 2023-06-17 MED ORDER — ALUM & MAG HYDROXIDE-SIMETH 200-200-20 MG/5ML PO SUSP
30.0000 mL | Freq: Once | ORAL | Status: AC
  Administered 2023-06-17: 30 mL via ORAL
  Filled 2023-06-17: qty 30

## 2023-06-17 MED ORDER — LORAZEPAM 2 MG/ML IJ SOLN
1.0000 mg | Freq: Once | INTRAMUSCULAR | Status: AC
  Administered 2023-06-17: 1 mg via INTRAVENOUS
  Filled 2023-06-17: qty 1

## 2023-06-17 MED ORDER — SODIUM CHLORIDE 0.9 % IV BOLUS
1000.0000 mL | Freq: Once | INTRAVENOUS | Status: AC
  Administered 2023-06-17: 1000 mL via INTRAVENOUS

## 2023-06-17 MED ORDER — LIDOCAINE VISCOUS HCL 2 % MT SOLN
15.0000 mL | Freq: Once | OROMUCOSAL | Status: AC
  Administered 2023-06-17: 15 mL via ORAL
  Filled 2023-06-17: qty 15

## 2023-06-17 MED ORDER — LIDOCAINE VISCOUS HCL 2 % MT SOLN: 15.0000 mL | OROMUCOSAL | 1 refills | Status: DC | PRN

## 2023-06-17 MED ORDER — PANTOPRAZOLE SODIUM 40 MG PO TBEC: 40.0000 mg | DELAYED_RELEASE_TABLET | Freq: Every day | ORAL | 1 refills | Status: DC

## 2023-06-17 NOTE — ED Notes (Signed)
 BLUE TOP DRAWN AND SENT TO LAB - JUST IN CASE IT IS NEEDED

## 2023-06-17 NOTE — ED Triage Notes (Signed)
 Pt reports "I just dont feel well." He reports intermittent, shooting left sided chest pain, generalized abdominal pain/ burning and feeling lightheaded; onset today around 1130.

## 2023-06-17 NOTE — ED Provider Notes (Signed)
 [facility] Provider Note    Event Date/Time   First MD Initiated Contact with Patient 06/17/23 1924     (approximate)   History   Chest Pain   HPI  Epimenio Mittag is a 28 y.o. male who presents to the emergency department today because of concerns for chest discomfort, burning in his abdomen in the sense of dread.  The patient states symptoms started this morning after waking up after working third shift.  The patient does have a history of anxiety and this somewhat reminds him of issues he has had with that in the past but it has never been this severe.  He denies any other new medications or change in medications.  The patient denies any change in diet.     Physical Exam   Triage Vital Signs: ED Triage Vitals [06/17/23 1822]  Encounter Vitals Group     BP (!) 148/93     Systolic BP Percentile      Diastolic BP Percentile      Pulse Rate 82     Resp 18     Temp 98.1 F (36.7 C)     Temp Source Oral     SpO2 98 %     Weight 290 lb (131.5 kg)     Height 6\' 3"  (1.905 m)     Head Circumference      Peak Flow      Pain Score 7     Pain Loc      Pain Education      Exclude from Growth Chart     Most recent vital signs: Vitals:   06/17/23 1822  BP: (!) 148/93  Pulse: 82  Resp: 18  Temp: 98.1 F (36.7 C)  SpO2: 98%   General: Awake, alert, oriented. CV:  Good peripheral perfusion. Regular rate and rhythm. Resp:  Normal effort. Lungs clear. Abd:  No distention.   ED Results / Procedures / Treatments   Labs (all labs ordered are listed, but only abnormal results are displayed) Labs Reviewed  BASIC METABOLIC PANEL WITH GFR - Abnormal; Notable for the following components:      Result Value   Glucose, Bld 114 (*)    All other components within normal limits  CBC  LIPASE, BLOOD  CBG MONITORING, ED  TROPONIN I (HIGH SENSITIVITY)     EKG  I, Marylynn Soho, attending physician, personally viewed and interpreted this EKG  EKG  Time: 1828 Rate: 70 Rhythm: normal sinus rhythm Axis: normal Intervals: qtc 408 QRS: narrow ST changes: no st elevation Impression: normal ekg   RADIOLOGY I independently interpreted and visualized the CXR. My interpretation: No pneumonia Radiology interpretation:  IMPRESSION:  No active cardiopulmonary disease.      PROCEDURES:  Critical Care performed: No    MEDICATIONS ORDERED IN ED: Medications - No data to display   IMPRESSION / MDM / ASSESSMENT AND PLAN / ED COURSE  I reviewed the triage vital signs and the nursing notes.                              Differential diagnosis includes, but is not limited to, ACS, pneumonia, pneumothorax, esophagitis  Patient's presentation is most consistent with acute presentation with potential threat to life or bodily function.  Patient presented to the emergency department today because of concerns for chest pain abdominal pain and a sense of dread.  The patient does have a  history of anxiety.  Blood work here with negative troponin.  Chest x-ray without concerning abnormalities.  Patient did feel significant improvement after treatment here in the emergency department.  At this time I do think likely patient suffering from esophagitis.  I discussed this with the patient.  Will give patient prescription for antacids and viscous lidocaine.  Will give patient dietary guidelines.      FINAL CLINICAL IMPRESSION(S) / ED DIAGNOSES   Final diagnoses:  Atypical chest pain  Gastroesophageal reflux disease, unspecified whether esophagitis present     Rx / DC Orders       Note:  This document was prepared using Dragon voice recognition software and may include unintentional dictation errors.    Marylynn Soho, MD 06/17/23 2240

## 2023-06-18 ENCOUNTER — Other Ambulatory Visit: Payer: Self-pay

## 2023-06-18 ENCOUNTER — Emergency Department (HOSPITAL_BASED_OUTPATIENT_CLINIC_OR_DEPARTMENT_OTHER)

## 2023-06-18 ENCOUNTER — Emergency Department (HOSPITAL_BASED_OUTPATIENT_CLINIC_OR_DEPARTMENT_OTHER)
Admission: EM | Admit: 2023-06-18 | Discharge: 2023-06-19 | Disposition: A | Discharge status: Home / Self Care | Attending: Emergency Medicine | Admitting: Emergency Medicine

## 2023-06-18 DIAGNOSIS — J45909 Unspecified asthma, uncomplicated: Secondary | ICD-10-CM | POA: Insufficient documentation

## 2023-06-18 DIAGNOSIS — Z87891 Personal history of nicotine dependence: Secondary | ICD-10-CM | POA: Insufficient documentation

## 2023-06-18 DIAGNOSIS — R1012 Left upper quadrant pain: Secondary | ICD-10-CM | POA: Diagnosis present

## 2023-06-18 DIAGNOSIS — R7401 Elevation of levels of liver transaminase levels: Secondary | ICD-10-CM | POA: Insufficient documentation

## 2023-06-18 DIAGNOSIS — R110 Nausea: Secondary | ICD-10-CM | POA: Diagnosis not present

## 2023-06-18 DIAGNOSIS — R1032 Left lower quadrant pain: Secondary | ICD-10-CM | POA: Diagnosis not present

## 2023-06-18 LAB — CBC WITH DIFFERENTIAL/PLATELET
Abs Immature Granulocytes: 0.02 10*3/uL (ref 0.00–0.07)
Basophils Absolute: 0.1 10*3/uL (ref 0.0–0.1)
Basophils Relative: 1 %
Eosinophils Absolute: 0.1 10*3/uL (ref 0.0–0.5)
Eosinophils Relative: 2 %
HCT: 42.4 % (ref 39.0–52.0)
Hemoglobin: 14.8 g/dL (ref 13.0–17.0)
Immature Granulocytes: 0 %
Lymphocytes Relative: 29 %
Lymphs Abs: 2.1 10*3/uL (ref 0.7–4.0)
MCH: 31.9 pg (ref 26.0–34.0)
MCHC: 34.9 g/dL (ref 30.0–36.0)
MCV: 91.4 fL (ref 80.0–100.0)
Monocytes Absolute: 0.5 10*3/uL (ref 0.1–1.0)
Monocytes Relative: 6 %
Neutro Abs: 4.5 10*3/uL (ref 1.7–7.7)
Neutrophils Relative %: 62 %
Platelets: 180 10*3/uL (ref 150–400)
RBC: 4.64 MIL/uL (ref 4.22–5.81)
RDW: 13.2 % (ref 11.5–15.5)
WBC: 7.3 10*3/uL (ref 4.0–10.5)
nRBC: 0 % (ref 0.0–0.2)

## 2023-06-18 LAB — HEPATIC FUNCTION PANEL
ALT: 196 U/L — ABNORMAL HIGH (ref 0–44)
AST: 94 U/L — ABNORMAL HIGH (ref 15–41)
Albumin: 4.7 g/dL (ref 3.5–5.0)
Alkaline Phosphatase: 63 U/L (ref 38–126)
Bilirubin, Direct: 0.2 mg/dL (ref 0.0–0.2)
Indirect Bilirubin: 0.3 mg/dL (ref 0.3–0.9)
Total Bilirubin: 0.5 mg/dL (ref 0.0–1.2)
Total Protein: 7.7 g/dL (ref 6.5–8.1)

## 2023-06-18 LAB — BASIC METABOLIC PANEL WITH GFR
Anion gap: 11 (ref 5–15)
BUN: 10 mg/dL (ref 6–20)
CO2: 23 mmol/L (ref 22–32)
Calcium: 9.8 mg/dL (ref 8.9–10.3)
Chloride: 104 mmol/L (ref 98–111)
Creatinine, Ser: 1.07 mg/dL (ref 0.61–1.24)
GFR, Estimated: >60 mL/min (ref >60)
Glucose, Bld: 100 mg/dL — ABNORMAL HIGH (ref 70–99)
Potassium: 4.1 mmol/L (ref 3.5–5.1)
Sodium: 138 mmol/L (ref 135–145)

## 2023-06-18 LAB — LIPASE, BLOOD: Lipase: 24 U/L (ref 11–51)

## 2023-06-18 LAB — URINALYSIS, ROUTINE W REFLEX MICROSCOPIC
Bilirubin Urine: NEGATIVE
Glucose, UA: NEGATIVE mg/dL
Hgb urine dipstick: NEGATIVE
Ketones, ur: NEGATIVE mg/dL
Leukocytes,Ua: NEGATIVE
Nitrite: NEGATIVE
Protein, ur: NEGATIVE mg/dL
Specific Gravity, Urine: 1.039 — ABNORMAL HIGH (ref 1.005–1.030)
pH: 6.5 (ref 5.0–8.0)

## 2023-06-18 MED ORDER — IOHEXOL 300 MG/ML  SOLN
100.0000 mL | Freq: Once | INTRAMUSCULAR | Status: AC | PRN
  Administered 2023-06-18: 100 mL via INTRAVENOUS

## 2023-06-18 NOTE — ED Provider Notes (Signed)
 [facility] Emergency Department Provider Note MRN:  [id number]  Arrival date & time: 06/19/23     Chief Complaint   Abdominal pain History of Present Illness   Stephen Burnett is a 28 y.o. year-old male with no pertinent past medical history presenting to the ED with chief complaint of abdominal pain.  Left upper and left lower quadrant abdominal pain for the past 2 or 3 days.  Associated with nausea, decreased bowel movements.  Denies fever.  Pain is burning in nature.  Review of Systems  A thorough review of systems was obtained and all systems are negative except as noted in the HPI and PMH.   Patient's Health History    Past Medical History:  Diagnosis Date   Anxiety    Asthma    Chest pain    Palpitations     No past surgical history on file.  Family History  Problem Relation Age of Onset   Healthy Mother    Heart attack Father     Social History   Socioeconomic History   Marital status: Married    Spouse name: Not on file   Number of children: Not on file   Years of education: Not on file   Highest education level: Not on file  Occupational History   Not on file  Tobacco Use   Smoking status: Former    Current packs/day: 0.00    Average packs/day: 1 pack/day for 17.0 years (17.0 ttl pk-yrs)    Types: Cigarettes    Start date: 12/10/2003    Quit date: 12/09/2020    Years since quitting: 2.5   Smokeless tobacco: Current    Types: Snuff   Tobacco comments:    1/2 pack-pack per day  Vaping Use   Vaping status: Every Day  Substance and Sexual Activity   Alcohol use: Not Currently    Comment: rarely    Drug use: Not Currently   Sexual activity: Yes  Other Topics Concern   Not on file  Social History Narrative   Not on file   Social Drivers of Health   Financial Resource Strain: Low Risk  (02/18/2023)   Overall Financial Resource Strain (CARDIA)    Difficulty of Paying Living Expenses: Not very hard  Food Insecurity: No Food  Insecurity (02/18/2023)   Hunger Vital Sign    Worried About Running Out of Food in the Last Year: Never true    Ran Out of Food in the Last Year: Never true  Transportation Needs: No Transportation Needs (02/18/2023)   PRAPARE - Administrator, Civil Service (Medical): No    Lack of Transportation (Non-Medical): No  Physical Activity: Sufficiently Active (02/18/2023)   Exercise Vital Sign    Days of Exercise per Week: 5 days    Minutes of Exercise per Session: 30 min  Stress: No Stress Concern Present (02/18/2023)   [employer] of Occupational Health - Occupational Stress Questionnaire    Feeling of Stress : Not at all  Social Connections: Moderately Integrated (02/18/2023)   Social Connection and Isolation Panel [NHANES]    Frequency of Communication with Friends and Family: More than three times a week    Frequency of Social Gatherings with Friends and Family: Three times a week    Attends Religious Services: 1 to 4 times per year    Active Member of Clubs or Organizations: No    Attends Banker Meetings: Never    Marital Status: Married  Intimate  Partner Violence: Not At Risk (02/18/2023)   Humiliation, Afraid, Rape, and Kick questionnaire    Fear of Current or Ex-Partner: No    Emotionally Abused: No    Physically Abused: No    Sexually Abused: No     Physical Exam   Vitals:   06/19/23 0040 06/19/23 0157  BP:    Pulse: 60   Resp:    Temp:  98.4 F (36.9 C)  SpO2: 98%     CONSTITUTIONAL: Well-appearing, NAD NEURO/PSYCH:  Alert and oriented x 3, no focal deficits EYES:  eyes equal and reactive ENT/NECK:  no LAD, no JVD CARDIO: Regular rate, well-perfused, normal S1 and S2 PULM:  CTAB no wheezing or rhonchi GI/GU:  non-distended, non-tender MSK/SPINE:  No gross deformities, no edema SKIN:  no rash, atraumatic   *Additional and/or pertinent findings included in MDM below  Diagnostic and Interventional Summary    EKG  Interpretation Date/Time:    Ventricular Rate:    PR Interval:    QRS Duration:    QT Interval:    QTC Calculation:   R Axis:      Text Interpretation:         Labs Reviewed  BASIC METABOLIC PANEL WITH GFR - Abnormal; Notable for the following components:      Result Value   Glucose, Bld 100 (*)    All other components within normal limits  URINALYSIS, ROUTINE W REFLEX MICROSCOPIC - Abnormal; Notable for the following components:   Color, Urine COLORLESS (*)    Specific Gravity, Urine 1.039 (*)    All other components within normal limits  HEPATIC FUNCTION PANEL - Abnormal; Notable for the following components:   AST 94 (*)    ALT 196 (*)    All other components within normal limits  CBC WITH DIFFERENTIAL/PLATELET  LIPASE, BLOOD    CT ABDOMEN PELVIS W CONTRAST  Final Result      Medications  iohexol (OMNIPAQUE) 300 MG/ML solution 100 mL (100 mLs Intravenous Contrast Given 06/18/23 2231)  haloperidol lactate (HALDOL) injection 2 mg (2 mg Intravenous Given 06/19/23 0035)  famotidine (PEPCID) IVPB 20 mg premix (0 mg Intravenous Stopped 06/19/23 0132)     Procedures  /  Critical Care Procedures  ED Course and Medical Decision Making  Initial Impression and Ddx Patient is well-appearing in no acute distress with normal vital signs, reassuring abdominal exam with no rebound guarding or rigidity, really no tenderness.  Suspect GERD or gastritis.  However patient concerned that symptoms not going away, had some elevated LFTs at outside hospital.  Shared decision making utilized, obtaining CT abdomen.  Past medical/surgical history that increases complexity of ED encounter: None  Interpretation of Diagnostics I personally reviewed the Laboratory Testing and my interpretation is as follows: No significant blood count or electrolyte disturbance.  CT unremarkable  Patient Reassessment and Ultimate Disposition/Management     Patient feeling better, appropriate for  discharge.  Patient management required discussion with the following services or consulting groups:  None  Complexity of Problems Addressed Acute illness or injury that poses threat of life of bodily function  Additional Data Reviewed and Analyzed Further history obtained from: Further history from spouse/family member  Additional Factors Impacting ED Encounter Risk Consideration of hospitalization  Merrick Abe. Harless Lien, MD [facility] Emergency Medicine [facility] [email or web address]  Final Clinical Impressions(s) / ED Diagnoses     ICD-10-CM   1. Left upper quadrant abdominal pain  R10.12  ED Discharge Orders          Ordered    sucralfate (CARAFATE) 1 g tablet  4 times daily PRN        06/19/23 0151    promethazine (PHENERGAN) 25 MG tablet  Every 6 hours PRN        06/19/23 0151             Discharge Instructions Discussed with and Provided to Patient:     Discharge Instructions      You were evaluated in the Emergency Department and after careful evaluation, we did not find any emergent condition requiring admission or further testing in the hospital.  Your exam/testing today is overall reassuring.  Symptoms likely due to a viral gastritis.  Continue the pantoprazole  medication at home.  Can use the Carafate up to 4 times daily as needed for discomfort.  Also recommend use of the Phenergan as needed for nausea.  Plenty of fluids, rest.  Please return to the Emergency Department if you experience any worsening of your condition.   Thank you for allowing us  to be a part of your care.     Edson Graces, MD 06/19/23 [phone]

## 2023-06-18 NOTE — ED Triage Notes (Signed)
 Seen at [facility] yesterday for left abd pain. Labs and sent home with GI cocktail, lidocaine  viscous. Continues to worsen with pain and feels distended and tight. Last BM 2 days.

## 2023-06-19 MED ORDER — HALOPERIDOL LACTATE 5 MG/ML IJ SOLN
2.0000 mg | Freq: Once | INTRAMUSCULAR | Status: AC
  Administered 2023-06-19: 2 mg via INTRAVENOUS
  Filled 2023-06-19: qty 1

## 2023-06-19 MED ORDER — FAMOTIDINE IN NACL 20-0.9 MG/50ML-% IV SOLN
20.0000 mg | Freq: Once | INTRAVENOUS | Status: AC
  Administered 2023-06-19: 20 mg via INTRAVENOUS
  Filled 2023-06-19: qty 50

## 2023-06-19 MED ORDER — PROMETHAZINE HCL 25 MG PO TABS: 25.0000 mg | ORAL_TABLET | Freq: Four times a day (QID) | ORAL | 0 refills | Status: DC | PRN

## 2023-06-19 MED ORDER — SUCRALFATE 1 G PO TABS: 1.0000 g | ORAL_TABLET | Freq: Four times a day (QID) | ORAL | 0 refills | Status: DC | PRN

## 2023-06-19 NOTE — Discharge Instructions (Signed)
 You were evaluated in the Emergency Department and after careful evaluation, we did not find any emergent condition requiring admission or further testing in the hospital.  Your exam/testing today is overall reassuring.  Symptoms likely due to a viral gastritis.  Continue the pantoprazole  medication at home.  Can use the Carafate up to 4 times daily as needed for discomfort.  Also recommend use of the Phenergan as needed for nausea.  Plenty of fluids, rest.  Please return to the Emergency Department if you experience any worsening of your condition.   Thank you for allowing us  to be a part of your care.

## 2023-07-01 ENCOUNTER — Inpatient Hospital Stay: Admitting: Family Medicine

## 2023-07-07 ENCOUNTER — Other Ambulatory Visit: Payer: Self-pay

## 2023-07-07 ENCOUNTER — Emergency Department (HOSPITAL_BASED_OUTPATIENT_CLINIC_OR_DEPARTMENT_OTHER)
Admission: EM | Admit: 2023-07-07 | Discharge: 2023-07-08 | Disposition: A | Discharge status: Home / Self Care | Attending: Emergency Medicine | Admitting: Emergency Medicine

## 2023-07-07 ENCOUNTER — Encounter (HOSPITAL_BASED_OUTPATIENT_CLINIC_OR_DEPARTMENT_OTHER): Payer: Self-pay | Admitting: *Deleted

## 2023-07-07 DIAGNOSIS — R110 Nausea: Secondary | ICD-10-CM | POA: Diagnosis not present

## 2023-07-07 DIAGNOSIS — R109 Unspecified abdominal pain: Secondary | ICD-10-CM | POA: Diagnosis present

## 2023-07-07 DIAGNOSIS — J45909 Unspecified asthma, uncomplicated: Secondary | ICD-10-CM | POA: Diagnosis not present

## 2023-07-07 DIAGNOSIS — Z79899 Other long term (current) drug therapy: Secondary | ICD-10-CM | POA: Diagnosis not present

## 2023-07-07 DIAGNOSIS — R1012 Left upper quadrant pain: Secondary | ICD-10-CM | POA: Insufficient documentation

## 2023-07-07 NOTE — ED Triage Notes (Signed)
 Pt returning to ED with persistent nausea, epigastric pain and abd pain x 2 weeks. Patient reporting phenergan  and Carafate  he was prescribed have not helped

## 2023-07-08 ENCOUNTER — Emergency Department (HOSPITAL_BASED_OUTPATIENT_CLINIC_OR_DEPARTMENT_OTHER)
Admission: EM | Admit: 2023-07-08 | Discharge: 2023-07-09 | Disposition: A | Discharge status: Home / Self Care | Attending: Emergency Medicine | Admitting: Emergency Medicine

## 2023-07-08 ENCOUNTER — Encounter (HOSPITAL_BASED_OUTPATIENT_CLINIC_OR_DEPARTMENT_OTHER): Payer: Self-pay

## 2023-07-08 DIAGNOSIS — Z79899 Other long term (current) drug therapy: Secondary | ICD-10-CM | POA: Insufficient documentation

## 2023-07-08 DIAGNOSIS — R1012 Left upper quadrant pain: Secondary | ICD-10-CM | POA: Insufficient documentation

## 2023-07-08 LAB — COMPREHENSIVE METABOLIC PANEL WITH GFR
ALT: 192 U/L — ABNORMAL HIGH (ref 0–44)
AST: 85 U/L — ABNORMAL HIGH (ref 15–41)
Albumin: 4.3 g/dL (ref 3.5–5.0)
Alkaline Phosphatase: 68 U/L (ref 38–126)
Anion gap: 12 (ref 5–15)
BUN: 14 mg/dL (ref 6–20)
CO2: 25 mmol/L (ref 22–32)
Calcium: 9.9 mg/dL (ref 8.9–10.3)
Chloride: 102 mmol/L (ref 98–111)
Creatinine, Ser: 1.11 mg/dL (ref 0.61–1.24)
GFR, Estimated: >60 mL/min (ref >60)
Glucose, Bld: 125 mg/dL — ABNORMAL HIGH (ref 70–99)
Potassium: 4.6 mmol/L (ref 3.5–5.1)
Sodium: 138 mmol/L (ref 135–145)
Total Bilirubin: 0.3 mg/dL (ref 0.0–1.2)
Total Protein: 7.9 g/dL (ref 6.5–8.1)

## 2023-07-08 LAB — CBC WITH DIFFERENTIAL/PLATELET
Abs Immature Granulocytes: 0.01 10*3/uL (ref 0.00–0.07)
Basophils Absolute: 0.1 10*3/uL (ref 0.0–0.1)
Basophils Relative: 1 %
Eosinophils Absolute: 0 10*3/uL (ref 0.0–0.5)
Eosinophils Relative: 0 %
HCT: 41.6 % (ref 39.0–52.0)
Hemoglobin: 14.4 g/dL (ref 13.0–17.0)
Immature Granulocytes: 0 %
Lymphocytes Relative: 18 %
Lymphs Abs: 1.4 10*3/uL (ref 0.7–4.0)
MCH: 32.2 pg (ref 26.0–34.0)
MCHC: 34.6 g/dL (ref 30.0–36.0)
MCV: 93.1 fL (ref 80.0–100.0)
Monocytes Absolute: 0.4 10*3/uL (ref 0.1–1.0)
Monocytes Relative: 5 %
Neutro Abs: 6 10*3/uL (ref 1.7–7.7)
Neutrophils Relative %: 76 %
Platelets: 192 10*3/uL (ref 150–400)
RBC: 4.47 MIL/uL (ref 4.22–5.81)
RDW: 12.8 % (ref 11.5–15.5)
WBC: 7.9 10*3/uL (ref 4.0–10.5)
nRBC: 0 % (ref 0.0–0.2)

## 2023-07-08 LAB — LIPASE, BLOOD: Lipase: 24 U/L (ref 11–51)

## 2023-07-08 MED ORDER — LIDOCAINE VISCOUS HCL 2 % MT SOLN
15.0000 mL | Freq: Once | OROMUCOSAL | Status: AC
  Administered 2023-07-09: 15 mL via OROMUCOSAL
  Filled 2023-07-08: qty 15

## 2023-07-08 MED ORDER — LIDOCAINE VISCOUS HCL 2 % MT SOLN
15.0000 mL | Freq: Once | OROMUCOSAL | Status: AC
  Administered 2023-07-08: 15 mL via ORAL
  Filled 2023-07-08: qty 15

## 2023-07-08 MED ORDER — FAMOTIDINE 20 MG PO TABS
40.0000 mg | ORAL_TABLET | Freq: Once | ORAL | Status: AC
  Administered 2023-07-09: 40 mg via ORAL
  Filled 2023-07-08: qty 2

## 2023-07-08 MED ORDER — SODIUM CHLORIDE 0.9 % IV BOLUS
1000.0000 mL | Freq: Once | INTRAVENOUS | Status: AC
  Administered 2023-07-08: 1000 mL via INTRAVENOUS

## 2023-07-08 MED ORDER — ALUM & MAG HYDROXIDE-SIMETH 200-200-20 MG/5ML PO SUSP
30.0000 mL | Freq: Once | ORAL | Status: AC
  Administered 2023-07-09: 30 mL via ORAL
  Filled 2023-07-08: qty 30

## 2023-07-08 MED ORDER — ALUM & MAG HYDROXIDE-SIMETH 200-200-20 MG/5ML PO SUSP
30.0000 mL | Freq: Once | ORAL | Status: AC
  Administered 2023-07-08: 30 mL via ORAL
  Filled 2023-07-08: qty 30

## 2023-07-08 MED ORDER — ONDANSETRON HCL 4 MG/2ML IJ SOLN
4.0000 mg | Freq: Once | INTRAMUSCULAR | Status: AC
Start: 2023-07-08 — End: 2023-07-08
  Administered 2023-07-08: 4 mg via INTRAVENOUS
  Filled 2023-07-08: qty 2

## 2023-07-08 NOTE — ED Provider Notes (Signed)
  EMERGENCY DEPARTMENT AT [facility] Provider Note   CSN: [id number] Arrival date & time: 07/07/23  2307     History  Chief Complaint  Patient presents with   Abdominal Pain    Edwing Figley is a 28 y.o. male.  Patient is a 28 year old male with history of asthma, anxiety.  Patient presenting with complaints of nausea and abdominal discomfort.  This has been ongoing intermittently for several weeks.  He has been seen on 2 separate occasions during this period of time with negative workups including CT scan and laboratory studies.  He is currently taking Protonix  as needed and Carafate , but symptoms have returned.  He describes discomfort to the left upper quadrant along with nausea.  No diarrhea or constipation.  No fevers or chills.       Home Medications Prior to Admission medications   Medication Sig Start Date End Date Taking? Authorizing Provider  albuterol  (PROVENTIL ) (2.5 MG/3ML) 0.083% nebulizer solution Take 3 mLs (2.5 mg total) by nebulization every 6 (six) hours as needed for wheezing or shortness of breath. 06/13/20   Parrett, Macdonald Savoy, NP  albuterol  (VENTOLIN  HFA) 108 (90 Base) MCG/ACT inhaler Inhale 2 puffs into the lungs every 6 (six) hours as needed for wheezing or shortness of breath. 08/18/22   Abraham Abo, MD  EPINEPHrine 0.3 mg/0.3 mL IJ SOAJ injection Inject into the muscle. 04/11/18   [provider]  FLUoxetine  (PROZAC ) 20 MG tablet Take 1 tablet (20 mg total) by mouth daily. 02/18/23   Abraham Abo, MD  lidocaine  (XYLOCAINE ) 2 % solution Use as directed 15 mLs in the mouth or throat as needed (abdominal pain). 06/17/23   Marylynn Soho, MD  LORazepam  (ATIVAN ) 0.5 MG tablet Take 1 tablet (0.5 mg total) by mouth 2 (two) times daily as needed for anxiety. 07/29/21   Mayers, Cari S, PA-C  melatonin 5 MG TABS Take 5 mg by mouth at bedtime as needed (sleep).    [provider]  metoprolol  tartrate (LOPRESSOR ) 100 MG tablet Take 1  tablet (100 mg total) by mouth as directed. Take 1 tablet 2 hours prior to CT Scan 07/21/21   Jacqueline Matsu, MD  pantoprazole  (PROTONIX ) 40 MG tablet Take 1 tablet (40 mg total) by mouth daily. 06/17/23 06/16/24  Marylynn Soho, MD  promethazine  (PHENERGAN ) 25 MG tablet Take 1 tablet (25 mg total) by mouth every 6 (six) hours as needed for nausea or vomiting. 06/19/23   Edson Graces, MD  sucralfate  (CARAFATE ) 1 g tablet Take 1 tablet (1 g total) by mouth 4 (four) times daily as needed. 06/19/23   Edson Graces, MD      Allergies    Banana and Other    Review of Systems   Review of Systems  All other systems reviewed and are negative.   Physical Exam Updated Vital Signs BP (!) 151/94 (BP Location: Right Arm)   Pulse 66   Temp 98.7 F (37.1 C) (Oral)   Resp 16   Ht 6\' 3"  (1.905 m)   Wt 131.5 kg   SpO2 96%   BMI 36.24 kg/m  Physical Exam Vitals and nursing note reviewed.  Constitutional:      General: He is not in acute distress.    Appearance: He is well-developed. He is not diaphoretic.  HENT:     Head: Normocephalic and atraumatic.  Cardiovascular:     Rate and Rhythm: Normal rate and regular rhythm.     Heart sounds: No  murmur heard.    No friction rub.  Pulmonary:     Effort: Pulmonary effort is normal. No respiratory distress.     Breath sounds: Normal breath sounds. No wheezing or rales.  Abdominal:     General: Bowel sounds are normal. There is no distension.     Palpations: Abdomen is soft.     Tenderness: There is abdominal tenderness in the left upper quadrant. There is no right CVA tenderness, left CVA tenderness, guarding or rebound.  Musculoskeletal:        General: Normal range of motion.     Cervical back: Normal range of motion and neck supple.  Skin:    General: Skin is warm and dry.  Neurological:     Mental Status: He is alert and oriented to person, place, and time.     Coordination: Coordination normal.     ED Results / Procedures /  Treatments   Labs (all labs ordered are listed, but only abnormal results are displayed) Labs Reviewed  COMPREHENSIVE METABOLIC PANEL WITH GFR - Abnormal; Notable for the following components:      Result Value   Glucose, Bld 125 (*)    AST 85 (*)    ALT 192 (*)    All other components within normal limits  LIPASE, BLOOD  CBC WITH DIFFERENTIAL/PLATELET    EKG None  Radiology No results found.  Procedures Procedures    Medications Ordered in ED Medications  sodium chloride  0.9 % bolus 1,000 mL (0 mLs Intravenous Stopped 07/08/23 0121)  alum & mag hydroxide-simeth (MAALOX/MYLANTA) 200-200-20 MG/5ML suspension 30 mL (30 mLs Oral Given 07/08/23 0021)    And  lidocaine  (XYLOCAINE ) 2 % viscous mouth solution 15 mL (15 mLs Oral Given 07/08/23 0021)  ondansetron  (ZOFRAN ) injection 4 mg (4 mg Intravenous Given 07/08/23 0023)    ED Course/ Medical Decision Making/ A&P  Patient presenting with left upper quadrant pain and nausea as described in the HPI.  Patient arrives with stable vital signs and is afebrile.  He has mild tenderness to the left upper quadrant, but no peritoneal signs.  Laboratory studies obtained including CBC, CMP, and lipase.  Patient has no white count, no elevation of his pancreatic enzymes, and no significant electrolyte derangement.  He does have mild elevations of his transaminases and alk phos, but this is consistent with previous studies.  Patient has been hydrated with normal saline and given a GI cocktail and Zofran  and is feeling better.  Cause of his pain is unclear, but I suspect gastritis.  I will have him begin taking the Protonix  on a daily basis rather than as needed and have him continue the Carafate .  I will also place a GI referral if things are not improving in the next few days.  Final Clinical Impression(s) / ED Diagnoses Final diagnoses:  None    Rx / DC Orders ED Discharge Orders     None         Orvilla Blander, MD 07/08/23 [phone]

## 2023-07-08 NOTE — Discharge Instructions (Signed)
 Begin taking Protonix  daily rather than as needed.  Follow-up with gastroenterology if symptoms persist.  The contact information for [facility] gastroenterology has been provided in this discharge summary for you to call and make these arrangements.  Return to the ER for severe abdominal pain, high fevers, bloody stools, or for other new and concerning symptoms.

## 2023-07-08 NOTE — ED Triage Notes (Signed)
 Pt returns w continued epigastric pain, advises "lidocaine  was the only thing that helped." States he took "old lidocaine  this morning & it helped but then it wore off." Phenergan  & maalox PTA (approx 1800)  Pt denies bloodwork at triage "because I know what it is."

## 2023-07-09 MED ORDER — PANTOPRAZOLE SODIUM 40 MG PO TBEC: 40.0000 mg | DELAYED_RELEASE_TABLET | Freq: Two times a day (BID) | ORAL | 1 refills | Status: DC

## 2023-07-09 MED ORDER — SUCRALFATE 1 G PO TABS: 1.0000 g | ORAL_TABLET | Freq: Four times a day (QID) | ORAL | 0 refills | Status: DC | PRN

## 2023-07-09 NOTE — ED Provider Notes (Signed)
 [place] EMERGENCY DEPARTMENT AT [facility] Provider Note   CSN: [id number] Arrival date & time: 07/08/23  2016     History  Chief Complaint  Patient presents with   Abdominal Pain    epigastric    Stephen Burnett is a 28 y.o. male.  Patient returns with recurrent left upper quadrant abdominal pain.  He reports that he was seen in the ED several weeks ago and then again last night for the symptoms.  He has had workups and was told that it was likely gastritis.       Home Medications Prior to Admission medications   Medication Sig Start Date End Date Taking? Authorizing Provider  albuterol  (PROVENTIL ) (2.5 MG/3ML) 0.083% nebulizer solution Take 3 mLs (2.5 mg total) by nebulization every 6 (six) hours as needed for wheezing or shortness of breath. 06/13/20   Parrett, Macdonald Savoy, NP  albuterol  (VENTOLIN  HFA) 108 (90 Base) MCG/ACT inhaler Inhale 2 puffs into the lungs every 6 (six) hours as needed for wheezing or shortness of breath. 08/18/22   Abraham Abo, MD  EPINEPHrine 0.3 mg/0.3 mL IJ SOAJ injection Inject into the muscle. 04/11/18   [provider]  FLUoxetine  (PROZAC ) 20 MG tablet Take 1 tablet (20 mg total) by mouth daily. 02/18/23   Abraham Abo, MD  lidocaine  (XYLOCAINE ) 2 % solution Use as directed 15 mLs in the mouth or throat as needed (abdominal pain). 06/17/23   Marylynn Soho, MD  LORazepam  (ATIVAN ) 0.5 MG tablet Take 1 tablet (0.5 mg total) by mouth 2 (two) times daily as needed for anxiety. 07/29/21   Mayers, Cari S, PA-C  melatonin 5 MG TABS Take 5 mg by mouth at bedtime as needed (sleep).    [provider]  metoprolol  tartrate (LOPRESSOR ) 100 MG tablet Take 1 tablet (100 mg total) by mouth as directed. Take 1 tablet 2 hours prior to CT Scan 07/21/21   Jacqueline Matsu, MD  pantoprazole  (PROTONIX ) 40 MG tablet Take 1 tablet (40 mg total) by mouth 2 (two) times daily before a meal. 07/09/23   Cristal Howatt, Marine Sia, MD  promethazine  (PHENERGAN ) 25  MG tablet Take 1 tablet (25 mg total) by mouth every 6 (six) hours as needed for nausea or vomiting. 06/19/23   Edson Graces, MD  sucralfate  (CARAFATE ) 1 g tablet Take 1 tablet (1 g total) by mouth 4 (four) times daily as needed. 07/09/23   Ballard Bongo, MD      Allergies    Banana and Other    Review of Systems   Review of Systems  Physical Exam Updated Vital Signs BP (!) 156/91   Pulse 61   Temp 98.6 F (37 C)   Resp 16   SpO2 98%  Physical Exam Vitals and nursing note reviewed.  Constitutional:      General: He is not in acute distress.    Appearance: He is well-developed.  HENT:     Head: Normocephalic and atraumatic.     Mouth/Throat:     Mouth: Mucous membranes are moist.  Eyes:     General: Vision grossly intact. Gaze aligned appropriately.     Extraocular Movements: Extraocular movements intact.     Conjunctiva/sclera: Conjunctivae normal.  Cardiovascular:     Rate and Rhythm: Normal rate and regular rhythm.     Pulses: Normal pulses.     Heart sounds: Normal heart sounds, S1 normal and S2 normal. No murmur heard.    No friction rub. No gallop.  Pulmonary:     Effort: Pulmonary effort is normal. No respiratory distress.     Breath sounds: Normal breath sounds.  Abdominal:     Palpations: Abdomen is soft.     Tenderness: There is abdominal tenderness in the left upper quadrant. There is no guarding or rebound.     Hernia: No hernia is present.  Musculoskeletal:        General: No swelling.     Cervical back: Full passive range of motion without pain, normal range of motion and neck supple. No pain with movement, spinous process tenderness or muscular tenderness. Normal range of motion.     Right lower leg: No edema.     Left lower leg: No edema.  Skin:    General: Skin is warm and dry.     Capillary Refill: Capillary refill takes less than 2 seconds.     Findings: No ecchymosis, erythema, lesion or wound.  Neurological:     Mental Status: He is  alert and oriented to person, place, and time.     GCS: GCS eye subscore is 4. GCS verbal subscore is 5. GCS motor subscore is 6.     Cranial Nerves: Cranial nerves 2-12 are intact.     Sensory: Sensation is intact.     Motor: Motor function is intact. No weakness or abnormal muscle tone.     Coordination: Coordination is intact.  Psychiatric:        Mood and Affect: Mood normal.        Speech: Speech normal.        Behavior: Behavior normal.     ED Results / Procedures / Treatments   Labs (all labs ordered are listed, but only abnormal results are displayed) Labs Reviewed - No data to display  EKG None  Radiology No results found.  Procedures Procedures    Medications Ordered in ED Medications  alum & mag hydroxide-simeth (MAALOX/MYLANTA) 200-200-20 MG/5ML suspension 30 mL (30 mLs Oral Given 07/09/23 0019)  lidocaine  (XYLOCAINE ) 2 % viscous mouth solution 15 mL (15 mLs Mouth/Throat Given 07/09/23 0019)  famotidine  (PEPCID ) tablet 40 mg (40 mg Oral Given 07/09/23 0019)    ED Course/ Medical Decision Making/ A&P                                 Medical Decision Making Risk OTC drugs. Prescription drug management.   Differential Diagnosis considered includes, but not limited to: Cholelithiasis; cholecystitis; cholangitis; bowel obstruction; esophagitis; gastritis; peptic ulcer disease; pancreatitis; cardiac.   Returns with recurrent left upper quadrant abdominal discomfort.  He has been diagnosed with gastritis.  He reports that he has a generalized anxiety disorder and is under a lot of stress.  He has been using Maalox at home.  He was previously prescribed Protonix  and Carafate  but was using these as needed.  Patient given symptomatic treatment today.  He declined repeat labs and this is reasonable.  Reaffirmed with the patient that Protonix  will only work if it is taken regularly, cannot be used as needed.  Will increase this to twice a day.  Will represcribe  Carafate .  He can continue to use Maalox or Pepcid  as needed.  He will call [facility] gastroenterology in the morning for further outpatient management.  Return for symptoms of GI bleeding or uncontrolled pain.        Final Clinical Impression(s) / ED Diagnoses Final diagnoses:  Left upper quadrant abdominal  pain    Rx / DC Orders ED Discharge Orders          Ordered    pantoprazole  (PROTONIX ) 40 MG tablet  2 times daily before meals        07/09/23 0108    sucralfate  (CARAFATE ) 1 g tablet  4 times daily PRN        07/09/23 0108              Palmira Stickle J, MD 07/09/23 [id number]

## 2023-07-12 ENCOUNTER — Ambulatory Visit: Payer: Self-pay

## 2023-07-12 NOTE — Telephone Encounter (Signed)
 Chief Complaint: diarrhea Symptoms: low appetite, hot flashes, chills, nausea, severe diarrhea, LUQ abdominal pain radiates to RUQ Frequency: intermittent abdominal pain, diarrhea x 2 days Pertinent Negatives: Patient denies fever,  Disposition: [] ED /[] Urgent Care (no appt availability in office) / [x] Appointment(In office/virtual)/ []  [facility] [facility]/ [] Home Care/ [] Refused Recommended Disposition /[] [place] [facility]/ []  Follow-up with PCP Additional Notes: Patient states he has been to the ED 4 times in the past month for his abdominal pain and diarrhea. Patient prescribed Phenergan , Protonix  and Carafate  as prescribed and states it has not improved his symptoms. Patient states he is waiting for a call back from GI today. Patient scheduled for HFU visit tomorrow with PA Shelvy Dickens (called CAL and confirmed okay to book with PA Shelvy Dickens).  Copied from CRM [id number]. Topic: Clinical - Red Word Triage >> Jul 12, 2023 10:21 AM Zipporah Him wrote: Red Word that prompted transfer to Nurse Triage: Abdominal cramping, diarrhea,  no appetite, was seen in ER on 5/29 meds not helping. Reason for Disposition  [1] SEVERE diarrhea (e.g., 7 or more times / day more than normal) AND [2] present > 24 hours (1 day)  Answer Assessment - Initial Assessment Questions 1. DIARRHEA SEVERITY: "How bad is the diarrhea?" "How many more stools have you had in the past 24 hours than normal?"    - NO DIARRHEA (SCALE 0)   - MILD (SCALE 1-3): Few loose or mushy BMs; increase of 1-3 stools over normal daily number of stools; mild increase in ostomy output.   -  MODERATE (SCALE 4-7): Increase of 4-6 stools daily over normal; moderate increase in ostomy output.   -  SEVERE (SCALE 8-10; OR "WORST POSSIBLE"): Increase of 7 or more stools daily over normal; moderate increase in ostomy output; incontinence.     10 or more episodes, baseline is 2-3 bowel movements daily.  2. ONSET: "When did the diarrhea begin?"      X 2  days.  3. BM CONSISTENCY: "How loose or watery is the diarrhea?"      Both.  4. VOMITING: "Are you also vomiting?" If Yes, ask: "How many times in the past 24 hours?"      No.  5. ABDOMEN PAIN: "Are you having any abdomen pain?" If Yes, ask: "What does it feel like?" (e.g., crampy, dull, intermittent, constant)      No.  6. ABDOMEN PAIN SEVERITY: If present, ask: "How bad is the pain?"  (e.g., Scale 1-10; mild, moderate, or severe)   - MILD (1-3): doesn't interfere with normal activities, abdomen soft and not tender to touch    - MODERATE (4-7): interferes with normal activities or awakens from sleep, abdomen tender to touch    - SEVERE (8-10): excruciating pain, doubled over, unable to do any normal activities       No pain at this time, comes and goes and when it does come on it is severe.  7. ORAL INTAKE: If vomiting, "Have you been able to drink liquids?" "How much liquids have you had in the past 24 hours?"     No vomiting, but states he has been able to drink Gatorade and at least 4-5 cups of water.  8. HYDRATION: "Any signs of dehydration?" (e.g., dry mouth [not just dry lips], too weak to stand, dizziness, new weight loss) "When did you last urinate?"     He states sometimes he has to make himself pee. Last time he urinated was 15 minutes.  9. EXPOSURE: "Have you  traveled to a foreign country recently?" "Have you been exposed to anyone with diarrhea?" "Could you have eaten any food that was spoiled?"     No.  10. ANTIBIOTIC USE: "Are you taking antibiotics now or have you taken antibiotics in the past 2 months?"       No.  11. OTHER SYMPTOMS: "Do you have any other symptoms?" (e.g., fever, blood in stool)       Hot flashes, low appetite, chills, nausea  12. PREGNANCY: "Is there any chance you are pregnant?" "When was your last menstrual period?"       N/A.  Protocols used: [facility]

## 2023-07-12 NOTE — Telephone Encounter (Signed)
Noted on schedule °

## 2023-07-13 ENCOUNTER — Other Ambulatory Visit: Payer: Self-pay | Admitting: Physician Assistant

## 2023-07-13 ENCOUNTER — Encounter: Payer: Self-pay | Admitting: Physician Assistant

## 2023-07-13 ENCOUNTER — Ambulatory Visit (INDEPENDENT_AMBULATORY_CARE_PROVIDER_SITE_OTHER): Admitting: Physician Assistant

## 2023-07-13 VITALS — BP 142/103 | HR 89 | Temp 98.2°F | Resp 16 | Ht 75.0 in | Wt 289.2 lb

## 2023-07-13 DIAGNOSIS — R197 Diarrhea, unspecified: Secondary | ICD-10-CM | POA: Diagnosis not present

## 2023-07-13 DIAGNOSIS — Z09 Encounter for follow-up examination after completed treatment for conditions other than malignant neoplasm: Secondary | ICD-10-CM

## 2023-07-13 DIAGNOSIS — R110 Nausea: Secondary | ICD-10-CM | POA: Diagnosis not present

## 2023-07-13 DIAGNOSIS — R7989 Other specified abnormal findings of blood chemistry: Secondary | ICD-10-CM | POA: Diagnosis not present

## 2023-07-13 MED ORDER — PROMETHAZINE HCL 25 MG PO TABS: 25.0000 mg | ORAL_TABLET | Freq: Four times a day (QID) | ORAL | 0 refills | Status: DC | PRN

## 2023-07-13 NOTE — Progress Notes (Signed)
 Patient ID: Stephen Burnett, male   DOB: 1995-07-10, 28 y.o.   MRN: [id number]   Stephen Burnett, is a 28 y.o. male  NWG:[id number]  [id number]  DOB - 1995/11/13  Chief Complaint  Patient presents with   Medical Management of Chronic Issues       Subjective:   Stephen Burnett is a 28 y.o. male here today for a follow up visit after being seen in the ED multiple times for diarrhea and LUQ/RUQ pain.  Most recent episode started 07/07/2023.  LFT were elevated.  CT WNL.  He is starting to feel better.  Had a shorter episode about 3 weeks ago that started after drinking alcohol.  This past episode also began after drinking alcohol.  His wife and baby have not been sick.  No sick coworkers know.  He sees GI tomorrow.  No melena/hematochezia.  No vomiting but he has had nausea.  Diarrhea has decreased to 4 episodes yesterday and none so far today.  On [employer].  No travel.  No fevers. Works 3rd shift and feels week and may want to stay out tonight depending how he feels.  No localized abdominal pain.  Pain has been cramping and mild.  Appetite is improving.  H/o drug abuse in remission about 8 years.  Still drinks alcohol-mostly weekends and has been trying to cut back.  Has been to AA/NA.   No problems updated.  ALLERGIES: Allergies  Allergen Reactions   Banana Anaphylaxis   Other Anaphylaxis    bananas    PAST MEDICAL HISTORY: Past Medical History:  Diagnosis Date   Anxiety    Asthma    Chest pain    Palpitations     MEDICATIONS AT HOME: Prior to Admission medications   Medication Sig Start Date End Date Taking? Authorizing Provider  albuterol  (PROVENTIL ) (2.5 MG/3ML) 0.083% nebulizer solution Take 3 mLs (2.5 mg total) by nebulization every 6 (six) hours as needed for wheezing or shortness of breath. 06/13/20  Yes Parrett, Tammy S, NP  albuterol  (VENTOLIN  HFA) 108 (90 Base) MCG/ACT inhaler Inhale 2 puffs into the lungs every 6 (six) hours as needed for wheezing or shortness of breath.  08/18/22  Yes Abraham Abo, MD  EPINEPHrine 0.3 mg/0.3 mL IJ SOAJ injection Inject into the muscle. 04/11/18  Yes [provider]  FLUoxetine  (PROZAC ) 20 MG tablet Take 1 tablet (20 mg total) by mouth daily. 02/18/23  Yes Abraham Abo, MD  lidocaine  (XYLOCAINE ) 2 % solution Use as directed 15 mLs in the mouth or throat as needed (abdominal pain). 06/17/23  Yes Goodman, Graydon, MD  melatonin 5 MG TABS Take 5 mg by mouth at bedtime as needed (sleep).   Yes [provider]  metoprolol  tartrate (LOPRESSOR ) 100 MG tablet Take 1 tablet (100 mg total) by mouth as directed. Take 1 tablet 2 hours prior to CT Scan 07/21/21  Yes Turner, Rufus Council, MD  pantoprazole  (PROTONIX ) 40 MG tablet Take 1 tablet (40 mg total) by mouth 2 (two) times daily before a meal. 07/09/23  Yes Pollina, Marine Sia, MD  sucralfate  (CARAFATE ) 1 g tablet Take 1 tablet (1 g total) by mouth 4 (four) times daily as needed. 07/09/23  Yes Pollina, Marine Sia, MD  promethazine  (PHENERGAN ) 25 MG tablet Take 1-2 tablets (25-50 mg total) by mouth every 6 (six) hours as needed for nausea or vomiting. 07/13/23   Gweneth Fredlund, Stan Eans, PA-C    ROS: Neg HEENT Neg resp Neg cardiac Neg MS Neg psych Neg neuro  Objective:   Vitals:   07/13/23 0943 07/13/23 0948  BP: (!) 143/98 (!) 142/103  Pulse: 89   Resp: 16   Temp: 98.2 F (36.8 C)   TempSrc: Oral   SpO2: 98%   Weight: 289 lb 3.2 oz (131.2 kg)   Height: 6\' 3"  (1.905 m)    Exam General appearance : Awake, alert, not in any distress. Speech Clear. Not toxic looking HEENT: Atraumatic and Normocephalic, pupils equally reactive to light and accomodation, mucus membranes a little dry Neck: Supple, no JVD. No cervical lymphadenopathy.  Chest: Good air entry bilaterally, CTAB.  No rales/rhonchi/wheezing CVS: S1 S2 regular, no murmurs.  Abdomen: Bowel sounds present, Non tender and not distended with no gaurding, rigidity or rebound. Extremities: B/L Lower Ext shows no edema,  both legs are warm to touch Neurology: Awake alert, and oriented X 3, CN II-XII intact, Non focal Skin: No Rash  Data Review Lab Results  Component Value Date   HGBA1C 5.4 07/11/2019    Assessment & Plan   1. Diarrhea, unspecified type (Primary)-improving Push fluids, note OOW tonight See GI tomorrow - HAV, HBV Panel - HCV RNA quant rflx ultra or genotyp - Comprehensive metabolic panel  2. Elevated LFTs I have counseled the patient at length about substance abuse and addiction.  12 step meetings/recovery recommended.  Local 12 step meeting lists were given and attendance was encouraged.  Patient expresses understanding.   [email or web address].org is the website for narcotics anonymous [employer]com.cy (website) or 9412700175 is the information for alcoholics anonymous Both are free and immediately available for help with alcohol and drug use - HAV, HBV Panel - HCV RNA quant rflx ultra or genotyp - Comprehensive metabolic panel  3. Encounter for examination following treatment at hospital improving  4. Nausea -see GI tomorrow - promethazine  (PHENERGAN ) 25 MG tablet; Take 1-2 tablets (25-50 mg total) by mouth every 6 (six) hours as needed for nausea or vomiting.  Dispense: 30 tablet; Refill: 0    Return in about 8 weeks (around 09/07/2023) for PCP for chronic conditions.  The patient was given clear instructions to go to ER or return to medical center if symptoms don't improve, worsen or new problems develop. The patient verbalized understanding. The patient was told to call to get lab results if they haven't heard anything in the next week.      Dulce Gibbs, PA-C [facility] [place], [place] [phone]   07/13/2023, 10:06 AM

## 2023-07-13 NOTE — Progress Notes (Signed)
 Intermittent LUQ radiates to RUQ abdominal pain, severe diarrhea, low appetite. staying hydrated. Patient said that he is feeling 75% better then he has been. Patient has appt w/ GI

## 2023-07-13 NOTE — Patient Instructions (Signed)
 [email or web address] is the website for narcotics anonymous [employer]cy (website) or 872-527-4701 is the information for alcoholics anonymous Both are free and immediately available for help with alcohol and drug use

## 2023-07-14 ENCOUNTER — Ambulatory Visit: Payer: Self-pay | Admitting: Physician Assistant

## 2023-07-16 ENCOUNTER — Ambulatory Visit: Payer: Self-pay | Admitting: Physician Assistant

## 2023-07-16 LAB — SPECIMEN STATUS REPORT

## 2023-07-16 LAB — HAV, HBV PANEL

## 2023-07-19 LAB — COMPREHENSIVE METABOLIC PANEL WITH GFR
ALT: 96 IU/L
AST: 43 IU/L — ABNORMAL HIGH (ref 0–40)
Albumin: 4.8 g/dL
Alkaline Phosphatase: 70 IU/L (ref 44–121)
BUN/Creatinine Ratio: 11
BUN: 14 mg/dL
Bilirubin Total: 0.7 mg/dL (ref 0.0–1.2)
CO2: 18 mmol/L — ABNORMAL LOW (ref 20–29)
Calcium: 10.1 mg/dL
Chloride: 103 mmol/L (ref 96–106)
Creatinine, Ser: 1.23 mg/dL
Globulin, Total: 3.5 g/dL (ref 1.5–4.5)
Glucose: 90 mg/dL (ref 70–99)
Potassium: 4 mmol/L (ref 3.5–5.2)
Sodium: 140 mmol/L (ref 134–144)
Total Protein: 8.3 g/dL (ref 6.0–8.5)

## 2023-07-19 LAB — HCV RNA QUANT RFLX ULTRA OR GENOTYP
HCV Quant Baseline: 1520000 [IU]/mL
HCV log10: 6.182 {Log_IU}/mL

## 2023-07-19 LAB — HAV, HBV PANEL

## 2023-07-19 LAB — SPECIMEN STATUS REPORT

## 2023-07-19 LAB — HEPATITIS C GENOTYPE: Hepatitis C Genotype: 3

## 2023-07-20 ENCOUNTER — Ambulatory Visit: Payer: Self-pay | Admitting: Physician Assistant

## 2023-07-20 ENCOUNTER — Other Ambulatory Visit: Payer: Self-pay | Admitting: Physician Assistant

## 2023-07-20 DIAGNOSIS — R768 Other specified abnormal immunological findings in serum: Secondary | ICD-10-CM

## 2023-07-24 LAB — SPECIMEN STATUS REPORT

## 2023-07-24 LAB — COMPREHENSIVE METABOLIC PANEL WITH GFR
ALT: 83 IU/L
AST: 44 IU/L — ABNORMAL HIGH (ref 0–40)
Albumin: 5.2 g/dL
Alkaline Phosphatase: 75 IU/L (ref 44–121)
BUN/Creatinine Ratio: 11
BUN: 14 mg/dL
Bilirubin Total: 0.2 mg/dL (ref 0.0–1.2)
Calcium: 10.3 mg/dL
Chloride: 106 mmol/L (ref 96–106)
Creatinine, Ser: 1.31 mg/dL
Globulin, Total: 3.6 g/dL (ref 1.5–4.5)
Glucose: 96 mg/dL (ref 70–99)
Potassium: 4.2 mmol/L (ref 3.5–5.2)
Sodium: 146 mmol/L — ABNORMAL HIGH (ref 134–144)
Total Protein: 8.8 g/dL — ABNORMAL HIGH (ref 6.0–8.5)

## 2023-07-24 LAB — HCV RT-PCR, QUANT (NON-GRAPH)

## 2023-07-24 LAB — HCV AB W REFLEX TO QUANT PCR: HCV Ab: REACTIVE — AB

## 2023-08-09 ENCOUNTER — Other Ambulatory Visit (HOSPITAL_COMMUNITY): Payer: Self-pay

## 2023-08-09 ENCOUNTER — Telehealth: Payer: Self-pay

## 2023-08-09 NOTE — Telephone Encounter (Signed)
 Pharmacy Patient Advocate Encounter  Insurance verification completed.   The patient is insured through [employer]   Ran test claim for [employer]. Currently a quantity of 84 is a 28 day medication will need a PA . Stephen Burnett Name will need a PA.  Medication will need to be filled at [facility] or [facility]  This test claim was processed through [facility]- copay amounts may vary at other pharmacies due to pharmacy/plan contracts, or as the patient moves through the different stages of their insurance plan.

## 2023-08-12 ENCOUNTER — Encounter: Payer: Self-pay | Admitting: Infectious Diseases

## 2023-08-12 ENCOUNTER — Other Ambulatory Visit: Payer: Self-pay

## 2023-08-12 ENCOUNTER — Other Ambulatory Visit (HOSPITAL_COMMUNITY): Payer: Self-pay

## 2023-08-12 ENCOUNTER — Telehealth: Payer: Self-pay

## 2023-08-12 ENCOUNTER — Ambulatory Visit: Admitting: Infectious Diseases

## 2023-08-12 VITALS — BP 132/79 | HR 76 | Temp 98.1°F | Ht 75.0 in | Wt 309.0 lb

## 2023-08-12 DIAGNOSIS — K2900 Acute gastritis without bleeding: Secondary | ICD-10-CM | POA: Diagnosis not present

## 2023-08-12 DIAGNOSIS — R197 Diarrhea, unspecified: Secondary | ICD-10-CM | POA: Diagnosis not present

## 2023-08-12 DIAGNOSIS — Z23 Encounter for immunization: Secondary | ICD-10-CM

## 2023-08-12 DIAGNOSIS — B182 Chronic viral hepatitis C: Secondary | ICD-10-CM | POA: Diagnosis not present

## 2023-08-12 MED ORDER — MAVYRET 100-40 MG PO TABS: 3.0000 | ORAL_TABLET | Freq: Every day | ORAL | 1 refills | Status: DC

## 2023-08-12 NOTE — Assessment & Plan Note (Signed)
 Abdominal pain features and history consistent with gastritis.  Discussed that this is not related to his hepatitis C infection.  He is finishing out a course of pantoprazole  (5 to 6 weeks).  We discussed stopping this while he takes the Mavyret to avoid any interrupted absorption of his hepatitis C medication.  He understood and agreed with that.  Avoiding triggers such as alcohol smoking coffee icy foods etc. would be recommended.

## 2023-08-12 NOTE — Assessment & Plan Note (Signed)
 New Patient with Chronic Hepatitis C genotype 3, treatment naive. FIB4 0.75 (statistically low risk for any severe fibrosis).   I discussed with the patient the lab findings that confirm chronic hepatitis C as well as the natural history and progression of disease including about 30% of people who develop cirrhosis of the liver if left untreated and once cirrhosis is established there is a 2-7% risk per year of liver cancer and liver failure.  I discussed the importance of treatment and benefits in reducing the risk, even if significant liver fibrosis exists. I also discussed risk for re-infection following treatment should he not continue to modify risk factors.   Patient counseled extensively on limiting acetaminophen to no more than 2 grams daily, avoidance of alcohol. Transmission discussed with patient including sexual transmission, sharing razors and toothbrush.  Will need referral to gastroenterology if concern for cirrhosis He continues to work on sobriety and drug abstinence.  PA for Mavyret started  Hepatitis A and B vaccines provided  Pneumococcal vaccine at return visit.  Further work up to include liver staging through non-invasive serum analysis with APRI and FIB4 scores and Liver Fibrosis panel; U/S to follow if discordant or concerning results.  Will call [name] back once all results are in and counsel on medication over the phone. He will return 4 weeks after starting to meet with pharmacy team and check RNA at that time.   Will need to hold PPI during course of treatment.

## 2023-08-12 NOTE — Assessment & Plan Note (Signed)
?  if related to pantoprazole . Unrelated to Hepatitis C

## 2023-08-12 NOTE — Patient Instructions (Signed)
 When we start your treatment - will need to stop the pantoprazole  for the 8 weeks you are taking it.    Nice to meet you today!    We need to get a little more information about your hepatitis c infection before we start your treatment. I anticipate that we can get you started in a few weeks after we submit approval to your insurance to ensure payment. We may need to place referral for an ultrasound and/or gastroenterology if your blood work indicates more damage to the liver than expected.     ABOUT HEPATITIS C VIRUS:  Chronic Hepatitis C is the most common blood-borne infection in the [place] , affecting approximately 3 million people.  It is the leading cause of cirrhosis, liver cancer, and end stage liver disease requiring transplantation when this infection goes untreated for many years  The majority of people who are infected are unaware because there are not many early symptoms that are specific to this and often go undiagnosed until a specific blood test is drawn.   The hepatitis c virus is passed primarily through direct exposure of contaminated blood or body fluids. It is most efficiently transmitted through repeated exposure to infected blood.  Risk for sexual transmission is very low but is possible if there is high frequency of unprotected sexual activity with known hepatitis c partner or multiple partners of known status.  Over time, approximately 60-70% of people can develop some degree of liver disease. Cirrhosis occurs in 10-20% of those with chronic infection. 1-5% will get liver cancer, which has a very high rate of death.   Approximately 15-25% clear the infection without medication (usually in the first 6 months of becoming exposed to virus)  Newer medications provide over 95% cure rate when taken as prescribed    IN GENERAL ABOUT DIET  Persons living with chronic hepatitis c infection should eat a diet to maintain a healthy weight and avoid nutritional  deficiencies.   Completely avoiding alcohol is the best decision for your liver health. If unable to do so please limit alcohol to as little as possible to less than 1 standard drink a day - this is very irritating to your liver.  Limit tylenol use to less than 2,000 mg daily (two extra strength tablets only twice a day)  If you have cirrhosis of the liver please take no more than 1,000 mg tylenol a day  Patients with cirrhosis should not have protein restriction; we recommend a protein intake of approximately 1.2-1.5 g/kg/day.   For patients with cirrhosis and hepatic encephalopathy, the American Association for the Study of Liver Diseases (AASLD) recommended protein intake is 1.2-1.5 g/kg/day.  If you experience ascites (fluid accumulation in the abdomen associated with severe liver damage / cirrhosis) please limit sodium intake to < 2000 mg a day    UNTIL YOU HAVE BEEN TREATED AND CURED:  Use condoms with all sexual encounters or practice abstinence to avoid sexual transmission   No sharing of razors, toothbrushes, nail clippers or anything that could potentially have blood on it.   If you cut yourself please clean and cover any wounds or open sores to others do not come into contact with your blood.   If blood spills onto item/surface please clean with 1:10 bleach solution and allow to dry, EVEN if it is dried blood.    GENERAL HELPFUL HINTS ON HCV THERAPY:  1. Stay well-hydrated.  2. Notify the [facility] of any changes in your other over-the-counter/herbal  or prescription medications.  3. If you miss a dose of your medication, take the missed dose as soon as you remember. Return to your regular time/dose schedule the next day.   4.  Do not stop taking your medications without first talking with your healthcare provider.  5.  You will see our pharmacist-specialist within the first 2 weeks of starting your medication to monitor for any possible side effects.  6.  You will  have blood work once during treatment 4 weeks after your first pill. Again soon after treatment is completed and one final lab 3 months after your last pill to ensure cure!   TIPS TO BE SUCCESSFUL WITH DAILY MEDICATION USE:  1. Set a reminder on your phone  2. Try filling out a pill box for the week - pick a day and put one pill for every day during the week so you know right away if you missed a pill.   3. Have a trusted family member ask you about your medications.   4. Smartphone app    Medication we would like to use for you will be :   Mavyret Instructions:  Take Mavyret, three tablets (at the same time) daily with food. Please take ALL THREE PILLS AT ONCE. You should take it at approximately the same time every day. Treatment will be for 8 weeks. Do not miss a dose.    Do not run out of Mavyret! If you are down to one week of medication left and have not heard about your next shipment, please let us  know as soon as possible. You will be given 28 days of treatment at a time and will receive one refill.   If you need to start a new medication, prescription from your doctor or over the counter medication, you need to contact us  to make sure it does not interfere with Mavyret. There are several medications that can interfere with Mavyret and can make you sick or make the medication not work.  If you need to take a medication for acid reflux, you can take omeprazole 20mg  daily.     Tylenol (acetominophen) and Advil (ibuprofen) are safe to take with Harvoni if needed for headache, fever, pain.   IF YOU ARE ON BIRTH CONTROL PILLS, YOU RECEIVE A SHOT FOR BIRTH CONTROL, OR YOU HAVE AN IUD, please notify your provider to make sure it is safe with Mavyret.   DO NOT stop Mavyret unless instructed to by your provider. If you are hospitalized while taking this medication please bring it with you to the hospital to avoid interruption of therapy. Every pill is important!  The most common side  effects associated with Mavyret include:  Fatigue Headache Nausea Diarrhea Insomnia

## 2023-08-12 NOTE — Telephone Encounter (Signed)
 Submitted a Prior Authorization request to [employer] for Mavyret via CoverMyMeds. Will update once we receive a response.  J Code: CPT:  PA ID: [id number]

## 2023-08-12 NOTE — Progress Notes (Signed)
 Patient Name: Stephen Burnett  Date of Birth: 1995/04/30  MRN: [id number]  PCP: Tanda Bleacher, MD  Referring Provider: Danton Jon CHRISTELLA DEVONNA, Ph#: [phone]   Subjective   CC: New patient - initial evaluation and management of chronic hepatitis C infection.    HPI:  Stephen Burnett is a 28 y.o. male accompanied by his fiance today for first clinic visit to discuss hepatitis C treatment.  He met with a new healthcare provider and with routine health screenings was found to be positive for hepatitis C infection with a viral load over 1.5 million, Genotype 3. Stephen Burnett says he has a history of intranasal and injection drug use that started around 2017.  He believes he had testing when he went to rehab several years ago.  He has never had treatment for this.  He has some mildly elevated liver function tests with a ALT in the 40s . These have been fluctuating in the past.  He also reports at least moderate alcohol use. He had an ER visit recently for abdominal pain in the left upper quadrant.  CT scan of the liver was reviewed and showed no complicating features of his liver or gallbladder.  No findings of cirrhosis mass or tumor.  Only some elevated liver function tests.    Review of Systems  Constitutional:  Negative for chills, fever, malaise/fatigue and weight loss.  HENT:  Negative for sore throat.   Respiratory:  Negative for cough, sputum production and shortness of breath.   Cardiovascular: Negative.   Gastrointestinal:  Positive for abdominal pain and diarrhea. Negative for vomiting.  Musculoskeletal:  Negative for joint pain, myalgias and neck pain.  Skin:  Negative for rash.  Neurological:  Negative for headaches.  Psychiatric/Behavioral:  Negative for depression and substance abuse. The patient is not nervous/anxious.     Past Medical History:  Diagnosis Date   Anxiety    Asthma    Chest pain    Palpitations     Outpatient Medications Prior to Visit  Medication Sig  Dispense Refill   albuterol  (PROVENTIL ) (2.5 MG/3ML) 0.083% nebulizer solution Take 3 mLs (2.5 mg total) by nebulization every 6 (six) hours as needed for wheezing or shortness of breath. 75 mL 12   albuterol  (VENTOLIN  HFA) 108 (90 Base) MCG/ACT inhaler Inhale 2 puffs into the lungs every 6 (six) hours as needed for wheezing or shortness of breath. 18 g 3   EPINEPHrine 0.3 mg/0.3 mL IJ SOAJ injection Inject into the muscle.     FLUoxetine  (PROZAC ) 20 MG tablet Take 1 tablet (20 mg total) by mouth daily. 90 tablet 1   pantoprazole  (PROTONIX ) 40 MG tablet Take 1 tablet (40 mg total) by mouth 2 (two) times daily before a meal. 60 tablet 1   lidocaine  (XYLOCAINE ) 2 % solution Use as directed 15 mLs in the mouth or throat as needed (abdominal pain). 100 mL 1   melatonin 5 MG TABS Take 5 mg by mouth at bedtime as needed (sleep).     metoprolol  tartrate (LOPRESSOR ) 100 MG tablet Take 1 tablet (100 mg total) by mouth as directed. Take 1 tablet 2 hours prior to CT Scan 1 tablet 0   promethazine  (PHENERGAN ) 25 MG tablet Take 1-2 tablets (25-50 mg total) by mouth every 6 (six) hours as needed for nausea or vomiting. 30 tablet 0   sucralfate  (CARAFATE ) 1 g tablet Take 1 tablet (1 g total) by mouth 4 (four) times daily as needed. 40 tablet 0   No  facility-administered medications prior to visit.     Allergies  Allergen Reactions   Banana Anaphylaxis   Other Anaphylaxis    bananas    Social History   Tobacco Use   Smoking status: Every Day    Current packs/day: 0.00    Average packs/day: 1 pack/day for 17.0 years (17.0 ttl pk-yrs)    Types: Cigarettes, E-cigarettes    Start date: 12/10/2003    Last attempt to quit: 12/09/2020    Years since quitting: 2.6   Smokeless tobacco: Current    Types: Snuff   Tobacco comments:    1/2 pack-pack per day  Vaping Use   Vaping status: Every Day  Substance Use Topics   Alcohol use: Not Currently    Comment: rarely    Drug use: Not Currently    Types:  Methamphetamines, IV    Comment: 2018    Family History  Problem Relation Age of Onset   Healthy Mother    Heart attack Father          Objective   Today's Vitals   08/12/23 0905  BP: 132/79  Pulse: 76  Temp: 98.1 F (36.7 C)  TempSrc: Oral  SpO2: 96%  Weight: (!) 309 lb (140.2 kg)  Height: 6' 3 (1.905 m)   Body mass index is 38.62 kg/m.  Constitutional: in no apparent distress, well developed and well nourished, and oriented times 3 Eyes: anicteric Cardiovascular: Cor RRR Respiratory: normal effort  Gastrointestinal: left quadrant discomfort Musculoskeletal: normal gait  Skin: no rashes on exposed skin, no porphyria cutanea tarda     Assessment & Plan:   Problem List Items Addressed This Visit       Unprioritized   Acute gastritis without hemorrhage   Abdominal pain features and history consistent with gastritis.  Discussed that this is not related to his hepatitis C infection.  He is finishing out a course of pantoprazole  (5 to 6 weeks).  We discussed stopping this while he takes the Mavyret to avoid any interrupted absorption of his hepatitis C medication.  He understood and agreed with that.  Avoiding triggers such as alcohol smoking coffee icy foods etc. would be recommended.       Chronic hepatitis C without hepatic coma (HCC) - Primary   New Patient with Chronic Hepatitis C genotype 3, treatment naive. FIB4 0.75 (statistically low risk for any severe fibrosis).   I discussed with the patient the lab findings that confirm chronic hepatitis C as well as the natural history and progression of disease including about 30% of people who develop cirrhosis of the liver if left untreated and once cirrhosis is established there is a 2-7% risk per year of liver cancer and liver failure.  I discussed the importance of treatment and benefits in reducing the risk, even if significant liver fibrosis exists. I also discussed risk for re-infection following treatment  should he not continue to modify risk factors.   Patient counseled extensively on limiting acetaminophen to no more than 2 grams daily, avoidance of alcohol. Transmission discussed with patient including sexual transmission, sharing razors and toothbrush.  Will need referral to gastroenterology if concern for cirrhosis He continues to work on sobriety and drug abstinence.  PA for Mavyret started  Hepatitis A and B vaccines provided  Pneumococcal vaccine at return visit.  Further work up to include liver staging through non-invasive serum analysis with APRI and FIB4 scores and Liver Fibrosis panel; U/S to follow if discordant or concerning results.  Will  call Stephen Burnett back once all results are in and counsel on medication over the phone. He will return 4 weeks after starting to meet with pharmacy team and check RNA at that time.   Will need to hold PPI during course of treatment.       Relevant Medications   Glecaprevir-Pibrentasvir (MAVYRET) 100-40 MG TABS   Other Relevant Orders   Liver Fibrosis, FibroTest-ActiTest   Diarrhea   ?if related to pantoprazole . Unrelated to Hepatitis C       Other Visit Diagnoses       Need for hepatitis A vaccination       Relevant Orders   Hepatitis A vaccine adult IM (Completed)     Need for hepatitis B vaccination       Relevant Orders   Heplisav-B (HepB-CPG) Vaccine (Completed)       Orders Placed This Encounter  Procedures   Heplisav-B (HepB-CPG) Vaccine   Hepatitis A vaccine adult IM   Liver Fibrosis, FibroTest-ActiTest    Meds ordered this encounter  Medications   Glecaprevir-Pibrentasvir (MAVYRET) 100-40 MG TABS    Sig: Take 3 tablets by mouth daily after supper.    Dispense:  84 tablet    Refill:  1    4 week follow up after starting with pharmacy team   Stephen Fireman, MSN, NP-C [facility]  [email or web address] Pager: [phone] Office:  [phone] [facility] Main Line: [phone] *Secure Chat Communication Welcome

## 2023-08-16 ENCOUNTER — Ambulatory Visit: Payer: Self-pay | Admitting: Family Medicine

## 2023-08-16 ENCOUNTER — Encounter: Payer: Self-pay | Admitting: Family Medicine

## 2023-08-18 ENCOUNTER — Telehealth: Payer: Self-pay

## 2023-08-18 ENCOUNTER — Other Ambulatory Visit (HOSPITAL_COMMUNITY): Payer: Self-pay

## 2023-08-18 NOTE — Telephone Encounter (Signed)
 Received notification from [employer] regarding a prior authorization for Mavyret . Authorization has been APPROVED from 08/12/2023 to 10/07/2023.     Patient must fill through [facility] or [facility]   Authorization # [id number]

## 2023-08-22 LAB — LIVER FIBROSIS, FIBROTEST-ACTITEST
ALT: 65 U/L — ABNORMAL HIGH (ref 9–46)
Alpha-2-Macroglobulin: 268 mg/dL (ref 106–279)
Apolipoprotein A1: 130 mg/dL (ref 94–176)
Bilirubin: 0.2 mg/dL (ref 0.2–1.2)
Fibrosis Score: 0.2
GGT: 34 U/L (ref 3–90)
Haptoglobin: 75 mg/dL (ref 43–212)
Necroinflammat ACT Score: 0.35
Reference ID: 5582549

## 2023-08-24 ENCOUNTER — Ambulatory Visit: Payer: Self-pay | Admitting: Infectious Diseases

## 2023-08-24 ENCOUNTER — Other Ambulatory Visit: Payer: Self-pay | Admitting: Pharmacist

## 2023-08-24 ENCOUNTER — Other Ambulatory Visit (HOSPITAL_COMMUNITY): Payer: Self-pay

## 2023-08-24 DIAGNOSIS — B182 Chronic viral hepatitis C: Secondary | ICD-10-CM

## 2023-08-24 MED ORDER — MAVYRET 100-40 MG PO TABS: 3.0000 | ORAL_TABLET | Freq: Every day | ORAL | 1 refills | Status: AC

## 2023-08-24 NOTE — Progress Notes (Signed)
 Resending script to preferred pharmacy.  Alan Geralds, PharmD, CPP, BCIDP, AAHIVP Clinical Pharmacist Practitioner Infectious Diseases Clinical Pharmacist [facility]

## 2023-08-25 ENCOUNTER — Ambulatory Visit (INDEPENDENT_AMBULATORY_CARE_PROVIDER_SITE_OTHER)

## 2023-08-25 VITALS — BP 133/83 | HR 61 | Temp 97.9°F | Resp 16 | Ht 75.0 in | Wt 303.8 lb

## 2023-08-25 DIAGNOSIS — F411 Generalized anxiety disorder: Secondary | ICD-10-CM | POA: Diagnosis not present

## 2023-08-25 DIAGNOSIS — Z91018 Allergy to other foods: Secondary | ICD-10-CM

## 2023-08-25 DIAGNOSIS — Z720 Tobacco use: Secondary | ICD-10-CM

## 2023-08-25 DIAGNOSIS — Z7141 Alcohol abuse counseling and surveillance of alcoholic: Secondary | ICD-10-CM | POA: Diagnosis not present

## 2023-08-25 MED ORDER — FLUOXETINE HCL 20 MG PO TABS: 20.0000 mg | ORAL_TABLET | Freq: Every day | ORAL | 1 refills | Status: DC

## 2023-08-25 MED ORDER — EPINEPHRINE 0.3 MG/0.3ML IJ SOAJ: 0.3000 mg | INTRAMUSCULAR | 0 refills | Status: AC | PRN

## 2023-08-25 NOTE — Progress Notes (Signed)
 Patient ID: Stephen Burnett, male    DOB: 1995-03-18  MRN: [id number]  CC: Medical Management of Chronic Issues (Patient is here for their 6 month follow-up/Patient has no concerns today/Care gaps have been discussed with patient/ Medication refill)   Subjective: Stephen Burnett is a 28 y.o. male who presents for 6 month follow up and requesting medication refill. No acute concerns at this time. Taking medication as directed. Vapes daily, not interested in quitting. Has significantly decreased alcohol consumption since last bout of GI issues.   Patient Active Problem List   Diagnosis Date Noted   Chronic hepatitis C without hepatic coma (HCC) 08/12/2023   Acute gastritis without hemorrhage 08/12/2023   Diarrhea 08/12/2023   GAD (generalized anxiety disorder) 07/17/2021   Panic disorder 07/17/2021   Abnormal thyroid  blood test 07/17/2021   Class 2 obesity due to excess calories without serious comorbidity with body mass index (BMI) of 36.0 to 36.9 in adult 07/17/2021   Allergic rhinitis 06/13/2020   Smoking 06/13/2020   Asthma 06/22/2019   Closed boxer's fracture 11/07/2016   Closed nondisplaced fracture of base of fifth metacarpal bone of right hand 10/26/2014   Knee pain 06/06/2012       Allergies  Allergen Reactions   Banana Anaphylaxis   Other Anaphylaxis    bananas    Social History   Socioeconomic History   Marital status: Married    Spouse name: Not on file   Number of children: Not on file   Years of education: Not on file   Highest education level: 11th grade  Occupational History   Not on file  Tobacco Use   Smoking status: Every Day    Current packs/day: 0.00    Average packs/day: 1 pack/day for 17.0 years (17.0 ttl pk-yrs)    Types: Cigarettes, E-cigarettes    Start date: 12/10/2003    Last attempt to quit: 12/09/2020    Years since quitting: 2.7   Smokeless tobacco: Current    Types: Snuff   Tobacco comments:    1/2 pack-pack per day  Vaping Use    Vaping status: Every Day  Substance and Sexual Activity   Alcohol use: Not Currently    Comment: rarely    Drug use: Not Currently    Types: Methamphetamines, IV    Comment: 2018   Sexual activity: Yes  Other Topics Concern   Not on file  Social History Narrative   Not on file   Social Drivers of Health   Financial Resource Strain: Low Risk  (08/21/2023)   Overall Financial Resource Strain (CARDIA)    Difficulty of Paying Living Expenses: Not hard at all  Food Insecurity: No Food Insecurity (08/21/2023)   Hunger Vital Sign    Worried About Running Out of Food in the Last Year: Never true    Ran Out of Food in the Last Year: Never true  Transportation Needs: No Transportation Needs (08/21/2023)   PRAPARE - Administrator, Civil Service (Medical): No    Lack of Transportation (Non-Medical): No  Physical Activity: Sufficiently Active (08/21/2023)   Exercise Vital Sign    Days of Exercise per Week: 5 days    Minutes of Exercise per Session: 50 min  Stress: Stress Concern Present (08/21/2023)   [employer] of Occupational Health - Occupational Stress Questionnaire    Feeling of Stress: To some extent  Social Connections: Socially Isolated (08/21/2023)   Social Connection and Isolation Panel    Frequency of Communication  with Friends and Family: Once a week    Frequency of Social Gatherings with Friends and Family: Never    Attends Religious Services: Never    Database administrator or Organizations: No    Attends Engineer, structural: Not on file    Marital Status: Living with partner  Intimate Partner Violence: Not At Risk (02/18/2023)   Humiliation, Afraid, Rape, and Kick questionnaire    Fear of Current or Ex-Partner: No    Emotionally Abused: No    Physically Abused: No    Sexually Abused: No    Family History  Problem Relation Age of Onset   Healthy Mother    Heart attack Father     No past surgical history on file.  ROS: Review of  Systems  Constitutional:  Negative for activity change.  Respiratory:  Negative for chest tightness and shortness of breath.   Cardiovascular:  Negative for chest pain.  Neurological:  Negative for numbness.  Psychiatric/Behavioral:  Negative for behavioral problems.    Negative except as stated above  PHYSICAL EXAM: BP 133/83   Pulse 61   Temp 97.9 F (36.6 C) (Oral)   Resp 16   Ht 6' 3 (1.905 m)   Wt (!) 303 lb 12.8 oz (137.8 kg)   SpO2 98%   BMI 37.97 kg/m   Physical Exam   General appearance - alert, well appearing, and in no distress Mental status - alert, oriented to person, place, and time Chest - clear to auscultation, no wheezes, rales or rhonchi, symmetric air entry Heart - normal rate, regular rhythm, normal S1, S2, no murmurs, rubs, clicks or gallops Abdomen - soft, nontender, nondistended, no masses or organomegaly Neurological - alert, oriented, normal speech, no focal findings or movement disorder noted Musculoskeletal - full range of motion without pain    ASSESSMENT AND PLAN:  1. GAD (generalized anxiety disorder) - Appears stable, adhering to medication regimen. Meds refilled. - Follow up with Dr. Tanda in 6 months  - Continue FLUoxetine  (PROZAC ) 20 MG tablet; Take 1 tablet (20 mg total) by mouth daily. Dispense: 90 tablet; Refill: 1   2. 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 Alcohol and Smoking Cessation - Pt congratulated on decreased alcohol consumption, reviewed overall benefits. - Pt is not interested in smoking cessation at this time, counseling provided  Patient was given the opportunity to ask questions.  Patient verbalized understanding of the plan and was able to repeat key elements of the plan.   No orders of the defined types were placed in this encounter.    Requested Prescriptions   Signed Prescriptions Disp Refills   EPINEPHrine  0.3 mg/0.3 mL IJ SOAJ injection 1 each 0     Sig: Inject 0.3 mg into the muscle as needed for anaphylaxis.   FLUoxetine  (PROZAC ) 20 MG tablet 90 tablet 1    Sig: Take 1 tablet (20 mg total) by mouth daily.    Return in about 6 months (around 02/25/2024) for Follow up with Dr. Tanda.  Sula Leavy Rode, PA-C

## 2023-09-01 ENCOUNTER — Telehealth: Payer: Self-pay | Admitting: Pharmacist

## 2023-09-01 ENCOUNTER — Telehealth: Payer: Self-pay

## 2023-09-01 NOTE — Telephone Encounter (Signed)
 [facility] Patient Advocate Encounter   I have been unsuccessful in reaching patient to be able to refill medication and to make 1 month follow up.  I called [employer] [facility] and was told patient received his 1st box of Mavyret  to his home on 08/26/23.  Stephen Burnett, CPhT Specialty Pharmacy Patient [facility] Phone: [phone] Fax:  [phone]

## 2023-09-01 NOTE — Telephone Encounter (Signed)
 Patient is approved to receive Mavyret  x 8 weeks for chronic Hepatitis C infection. Counseled patient to take all three tablets of Mavyret  daily with food.  Counseled patient the need to take all three tablets together and to not separate them out during the day. Encouraged patient not to miss any doses and explained how their chance of cure could go down with each dose missed. Counseled patient on what to do if dose is missed - if it is closer to the missed dose take immediately; if closer to next dose then skip dose and take the next dose at the usual time. Counseled patient on common side effects such as headache, fatigue, and nausea and that these normally decrease with time. I reviewed patient medications and found no drug interactions. Discussed with patient that there are several drug interactions with Mavyret  and instructed patient to call the clinic if he wishes to start a new medication during course of therapy. Also advised patient to call if he experiences any side effects. Patient will follow-up with Cassie in the pharmacy clinic on 09/27/23.  Alan Geralds, PharmD, CPP, BCIDP, AAHIVP Clinical Pharmacist Practitioner Infectious Diseases Clinical Pharmacist [facility]

## 2023-09-24 NOTE — Progress Notes (Deleted)
 HPI: Stephen Burnett is a 28 y.o. male who presents to the [facility] pharmacy clinic for Hepatitis C follow-up.  Medication: Mavyret  x 8 weeks  Start Date: 08/27/23  Hepatitis C Genotype: 3  Fibrosis Score: F0  Hepatitis C RNA: 1.5 million on 07/13/23  Patient Active Problem List   Diagnosis Date Noted   Chronic hepatitis C without hepatic coma (HCC) 08/12/2023   Acute gastritis without hemorrhage 08/12/2023   Diarrhea 08/12/2023   GAD (generalized anxiety disorder) 07/17/2021   Panic disorder 07/17/2021   Abnormal thyroid  blood test 07/17/2021   Class 2 obesity due to excess calories without serious comorbidity with body mass index (BMI) of 36.0 to 36.9 in adult 07/17/2021   Allergic rhinitis 06/13/2020   Smoking 06/13/2020   Asthma 06/22/2019   Closed boxer's fracture 11/07/2016   Closed nondisplaced fracture of base of fifth metacarpal bone of right hand 10/26/2014   Knee pain 06/06/2012    Patient's Medications  New Prescriptions   No medications on file  Previous Medications   ALBUTEROL  (PROVENTIL ) (2.5 MG/3ML) 0.083% NEBULIZER SOLUTION    Take 3 mLs (2.5 mg total) by nebulization every 6 (six) hours as needed for wheezing or shortness of breath.   ALBUTEROL  (VENTOLIN  HFA) 108 (90 BASE) MCG/ACT INHALER    Inhale 2 puffs into the lungs every 6 (six) hours as needed for wheezing or shortness of breath.   EPINEPHRINE  0.3 MG/0.3 ML IJ SOAJ INJECTION    Inject 0.3 mg into the muscle as needed for anaphylaxis.   FLUOXETINE  (PROZAC ) 20 MG TABLET    Take 1 tablet (20 mg total) by mouth daily.   GLECAPREVIR-PIBRENTASVIR (MAVYRET ) 100-40 MG TABS    Take 3 tablets by mouth daily after supper.   PANTOPRAZOLE  (PROTONIX ) 40 MG TABLET    Take 1 tablet (40 mg total) by mouth 2 (two) times daily before a meal.  Modified Medications   No medications on file  Discontinued Medications   No medications on file    Labs: Hepatitis C Lab Results  Component Value Date   HCVGENOTYPE Comment  07/13/2023   FIBROSTAGE F0 08/12/2023   Hepatitis B Lab Results  Component Value Date   HEPBSAG CANCELED 07/13/2023   HEPBCAB CANCELED 07/13/2023   Hepatitis A Lab Results  Component Value Date   HAV CANCELED 07/13/2023   HIV Lab Results  Component Value Date   HIV Non Reactive 07/11/2019   Lab Results  Component Value Date   CREATININE CANCELED 07/13/2023   CREATININE 1.23 07/13/2023   CREATININE 1.31 07/13/2023   CREATININE 1.11 07/08/2023   CREATININE 1.07 06/18/2023   Lab Results  Component Value Date   AST CANCELED 07/13/2023   AST 43 (H) 07/13/2023   AST 44 (H) 07/13/2023   ALT 65 (H) 08/12/2023   ALT CANCELED 07/13/2023   ALT 96 07/13/2023   ALT 83 07/13/2023    Assessment: Stephen Burnett is here today to follow up for his Hepatitis C infection. He started 8 weeks of Mavyret  roughly a month ago. He is tolerating it well with ***. He takes all three tablets together with food at ***. He has it filled at [facility] and received his second month on ***. He has missed *** doses so far. Encouraged him to keep up the good work and not miss any doses during his second and final month. Will check labs today and see him back in 4 weeks for his end of therapy appointment.    Plan: -  Continue Mavyret  for 8 total weeks - Hep C RNA and CMP today - Follow up on *** with ***  Rodgers Likes L. Abshir Paolini, PharmD, BCIDP, AAHIVP, CPP Clinical Pharmacist Practitioner - Infectious Diseases Clinical Pharmacist Lead - Specialty Pharmacy [facility]

## 2023-09-27 ENCOUNTER — Ambulatory Visit: Admitting: Pharmacist

## 2023-09-27 DIAGNOSIS — B182 Chronic viral hepatitis C: Secondary | ICD-10-CM

## 2023-10-25 IMAGING — CR DG CHEST 2V
1 series · 2 of 2 positions shown · non-contrast
Comparison: 10/06/2019

CLINICAL DATA: Chest pain and shortness of breath

EXAM:
CHEST - 2 VIEW

[Series 1: dg chest 2 view · 0.14mm/px · 2 of 2 slices shown]
[im 1/2]
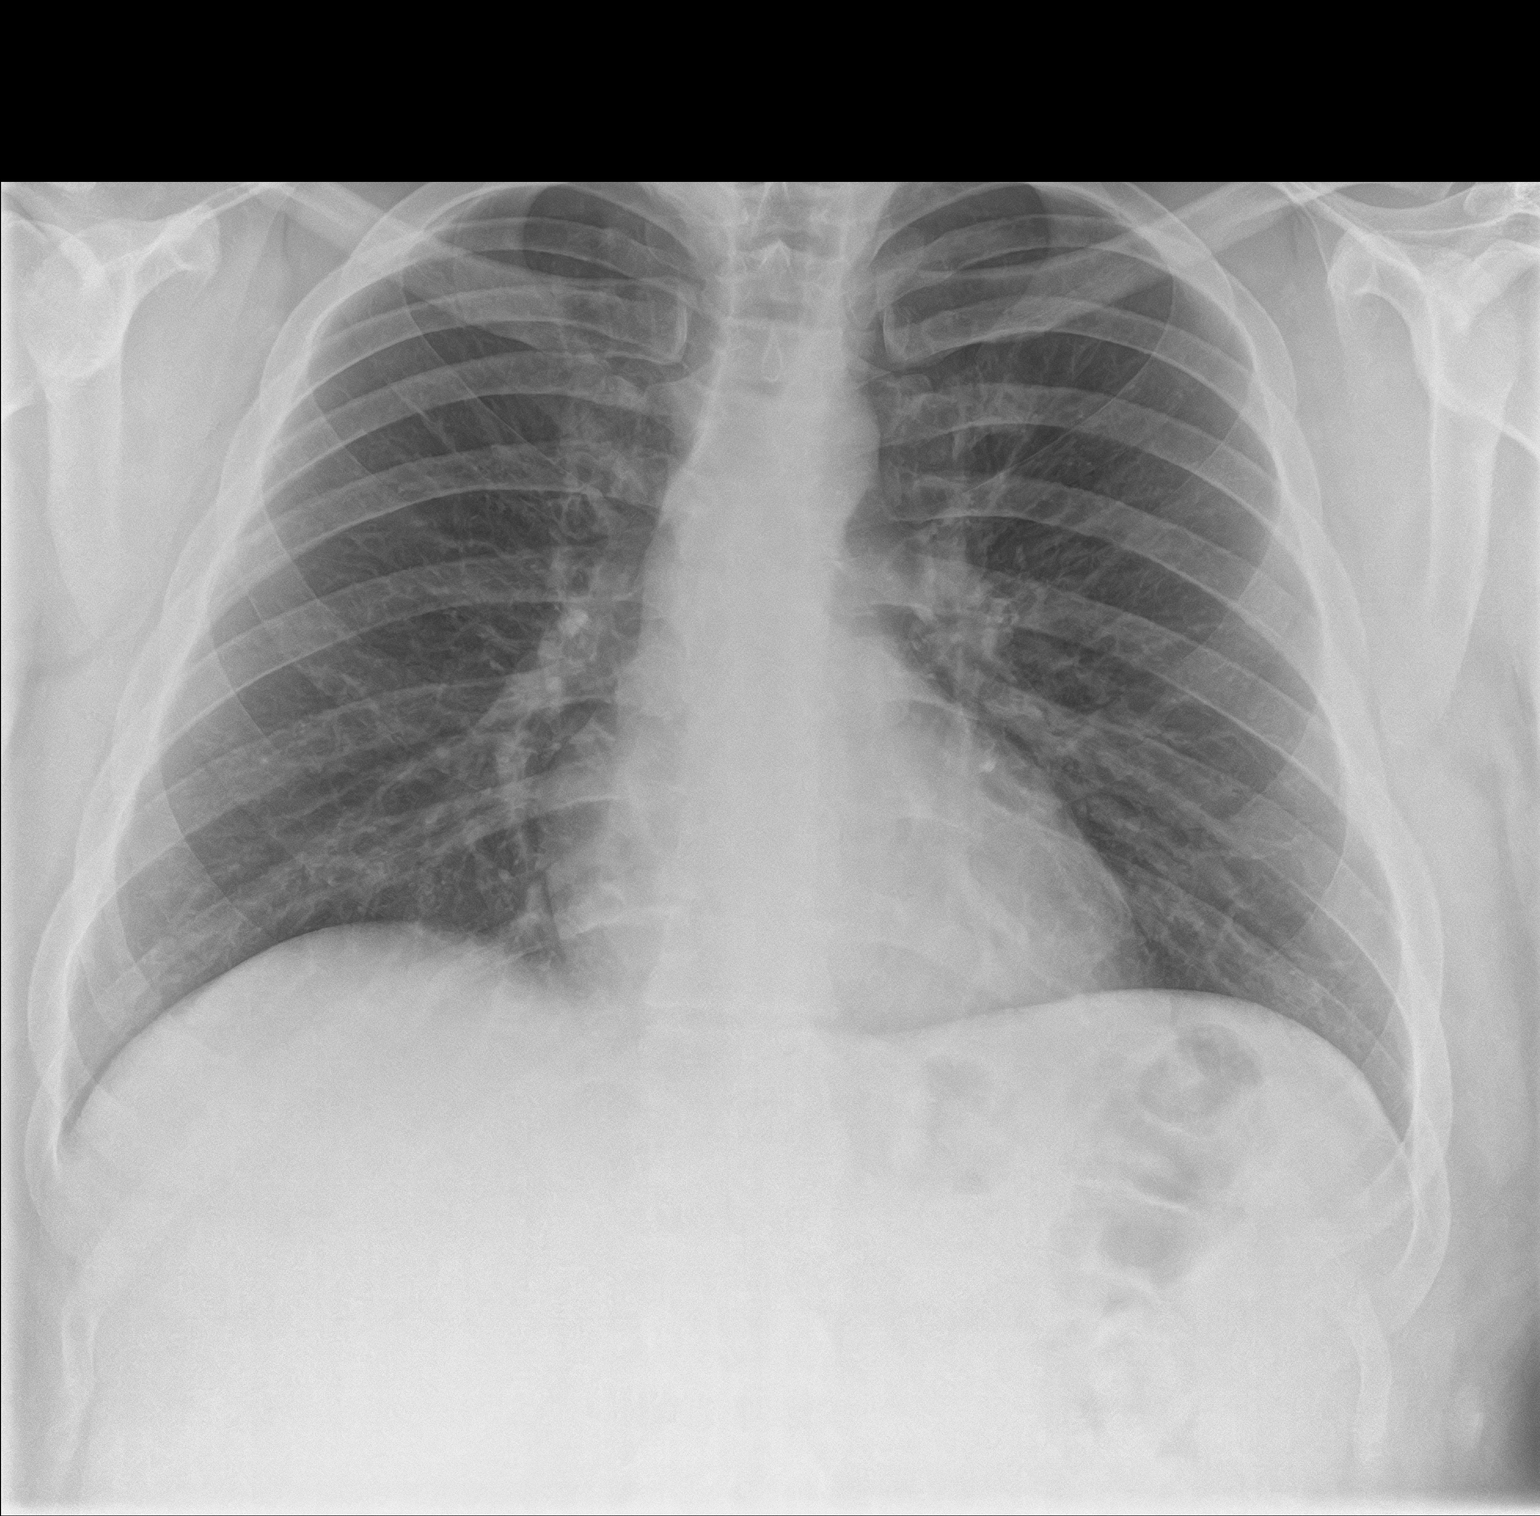
[im 2/2]
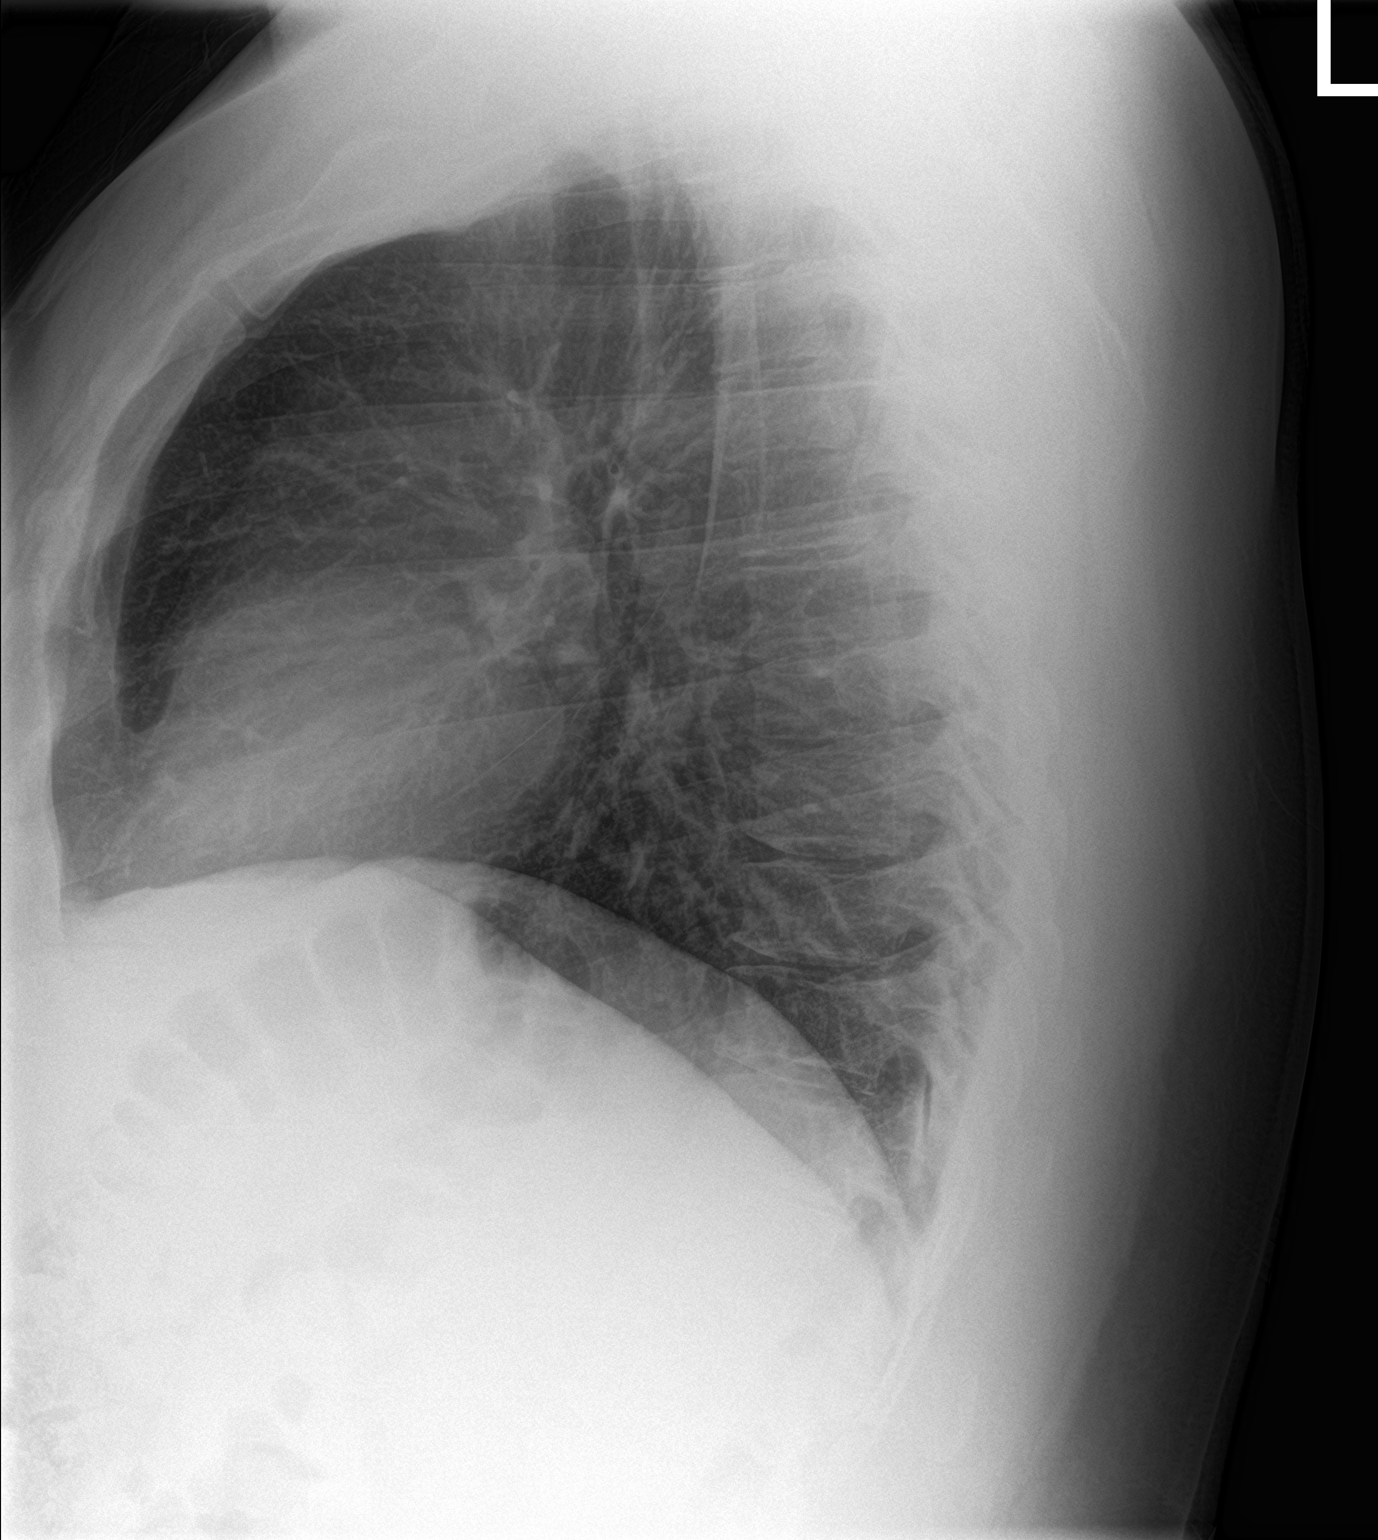

[2 of 2 positions shown; findings below may reference images not displayed]

FINDINGS: The heart size and mediastinal contours are within normal limits.
Both lungs are clear. The visualized skeletal structures are
unremarkable.
IMPRESSION: No active cardiopulmonary disease.

## 2023-12-23 ENCOUNTER — Emergency Department (HOSPITAL_BASED_OUTPATIENT_CLINIC_OR_DEPARTMENT_OTHER)
Admission: EM | Admit: 2023-12-23 | Discharge: 2023-12-23 | Disposition: A | Discharge status: Home / Self Care | Attending: Emergency Medicine | Admitting: Emergency Medicine

## 2023-12-23 ENCOUNTER — Emergency Department (HOSPITAL_BASED_OUTPATIENT_CLINIC_OR_DEPARTMENT_OTHER): Admitting: Radiology

## 2023-12-23 ENCOUNTER — Other Ambulatory Visit: Payer: Self-pay

## 2023-12-23 ENCOUNTER — Encounter (HOSPITAL_BASED_OUTPATIENT_CLINIC_OR_DEPARTMENT_OTHER): Payer: Self-pay

## 2023-12-23 DIAGNOSIS — W228XXA Striking against or struck by other objects, initial encounter: Secondary | ICD-10-CM | POA: Insufficient documentation

## 2023-12-23 DIAGNOSIS — M25571 Pain in right ankle and joints of right foot: Secondary | ICD-10-CM | POA: Insufficient documentation

## 2023-12-23 MED ORDER — ACETAMINOPHEN 500 MG PO TABS
1000.0000 mg | ORAL_TABLET | Freq: Once | ORAL | Status: AC
  Administered 2023-12-23: 1000 mg via ORAL
  Filled 2023-12-23: qty 2

## 2023-12-23 MED ORDER — KETOROLAC TROMETHAMINE 15 MG/ML IJ SOLN
15.0000 mg | Freq: Once | INTRAMUSCULAR | Status: AC
  Administered 2023-12-23: 15 mg via INTRAMUSCULAR
  Filled 2023-12-23: qty 1

## 2023-12-23 NOTE — ED Triage Notes (Signed)
 Patient has right ankle pain with increased swelling. States he has been wearing a brace but it has not been helping. No known injury but says that he stepped and it felt like a nail went through is foot. But this was three days ago. Redness and swelling noted.

## 2023-12-23 NOTE — Discharge Instructions (Signed)
 Please follow-up with your family doctor or the sports medicine clinic and have them recheck your ankle hopefully in about a week.  Your x-ray looks good.  No obvious broken bones or foreign bodies.  Take 4 over the counter ibuprofen tablets 3 times a day or 2 over-the-counter naproxen tablets twice a day for pain. Also take tylenol 1000mg (2 extra strength) four times a day.

## 2023-12-23 NOTE — ED Provider Notes (Signed)
 [place] EMERGENCY DEPARTMENT AT [facility] Provider Note   CSN: [id number] Arrival date & time: 12/23/23  9198     Patient presents with: Ankle Pain (Right)   Stephen Burnett is a 28 y.o. male.   84 yo M with a chief complaints of right ankle pain.  This started a few days ago.  He said he stepped and felt like he had stepped on something sharp and had significant pain afterwards.  That seemed to improve but now he has pain and swelling to his ankle.  He tried wearing an ankle brace but without significant improvement.  Denies overt trauma.   Ankle Pain      Prior to Admission medications   Medication Sig Start Date End Date Taking? Authorizing Provider  albuterol  (PROVENTIL ) (2.5 MG/3ML) 0.083% nebulizer solution Take 3 mLs (2.5 mg total) by nebulization every 6 (six) hours as needed for wheezing or shortness of breath. 06/13/20   Parrett, Madelin RAMAN, NP  albuterol  (VENTOLIN  HFA) 108 (90 Base) MCG/ACT inhaler Inhale 2 puffs into the lungs every 6 (six) hours as needed for wheezing or shortness of breath. 08/18/22   Tanda Bleacher, MD  EPINEPHrine  0.3 mg/0.3 mL IJ SOAJ injection Inject 0.3 mg into the muscle as needed for anaphylaxis. 08/25/23   Leavy Lucas Fox, PA-C  FLUoxetine  (PROZAC ) 20 MG tablet Take 1 tablet (20 mg total) by mouth daily. 08/25/23   Leavy Lucas Fox, PA-C  Glecaprevir-Pibrentasvir (MAVYRET ) 100-40 MG TABS Take 3 tablets by mouth daily after supper. 08/24/23   Waddell Alan PARAS, RPH-CPP  pantoprazole  (PROTONIX ) 40 MG tablet Take 1 tablet (40 mg total) by mouth 2 (two) times daily before a meal. 07/09/23   Pollina, Lonni PARAS, MD    Allergies: Banana and Other    Review of Systems  Updated Vital Signs BP 137/86 (BP Location: Right Arm)   Pulse 76   Temp 97.8 F (36.6 C)   Resp 18   SpO2 97%   Physical Exam Vitals and nursing note reviewed.  Constitutional:      Appearance: He is well-developed.  HENT:     Head: Normocephalic and atraumatic.   Eyes:     Pupils: Pupils are equal, round, and reactive to light.  Neck:     Vascular: No JVD.  Cardiovascular:     Rate and Rhythm: Normal rate and regular rhythm.     Heart sounds: No murmur heard.    No friction rub. No gallop.  Pulmonary:     Effort: No respiratory distress.     Breath sounds: No wheezing.  Abdominal:     General: There is no distension.     Tenderness: There is no abdominal tenderness. There is no guarding or rebound.  Musculoskeletal:        General: Normal range of motion.     Cervical back: Normal range of motion and neck supple.     Comments: Edema noted to the right ankle without any erythema or warmth.  Pulse motor and sensation are intact distally.  He has some mild erythema to the base of the foot without any obvious signs of a puncture wound.  No obvious pain at the attachment of the Achilles tendon to the calcaneus.  No obvious pain about the midfoot.  Skin:    Coloration: Skin is not pale.     Findings: No rash.  Neurological:     Mental Status: He is alert and oriented to person, place, and time.  Psychiatric:  Behavior: Behavior normal.     (all labs ordered are listed, but only abnormal results are displayed) Labs Reviewed - No data to display  EKG: None  Radiology: DG Ankle Complete Right Result Date: 12/23/2023 CLINICAL DATA:  Ankle pain and swelling. EXAM: RIGHT ANKLE - COMPLETE 3+ VIEW COMPARISON:  None Available. FINDINGS: No evidence for an acute fracture. No subluxation or dislocation. Features compatible with effusion in the anterior recess of the ankle. Prominent os trigonum. IMPRESSION: 1. No acute bony findings. 2. Ankle joint effusion. Electronically Signed   By: Camellia Candle M.D.   On: 12/23/2023 08:36     Procedures   Medications Ordered in the ED  ketorolac  (TORADOL ) 15 MG/ML injection 15 mg (15 mg Intramuscular Given 12/23/23 0818)  acetaminophen (TYLENOL) tablet 1,000 mg (1,000 mg Oral Given 12/23/23 0817)                                     Medical Decision Making Amount and/or Complexity of Data Reviewed Radiology: ordered.  Risk OTC drugs. Prescription drug management.   28 yo M with a chief complaint of right ankle pain.  Reportedly his atraumatically had some pain when he stepped down off of a porch a few days ago.  He reported it like he had stepped on a nail though I see no signs of obvious puncture wound.  Will obtain a plain film.  Try to immobilize.  Follow-up.  Plain film of the right ankle independently interpreted by me without fracture or foreign body.  Offered ASO and crutches.  Patient tells me that he cannot use crutches and has to go back to work.  Will place in a cam boot.  8:41 AM:  I have discussed the diagnosis/risks/treatment options with the patient and family.  Evaluation and diagnostic testing in the emergency department does not suggest an emergent condition requiring admission or immediate intervention beyond what has been performed at this time.  They will follow up with PCP, sports med. We also discussed returning to the ED immediately if new or worsening sx occur. We discussed the sx which are most concerning (e.g., sudden worsening pain, fever, inability to tolerate by mouth) that necessitate immediate return. Medications administered to the patient during their visit and any new prescriptions provided to the patient are listed below.  Medications given during this visit Medications  ketorolac  (TORADOL ) 15 MG/ML injection 15 mg (15 mg Intramuscular Given 12/23/23 0818)  acetaminophen (TYLENOL) tablet 1,000 mg (1,000 mg Oral Given 12/23/23 0817)     The patient appears reasonably screen and/or stabilized for discharge and I doubt any other medical condition or other [facility] requiring further screening, evaluation, or treatment in the ED at this time prior to discharge.       Final diagnoses:  Acute right ankle pain    ED Discharge Orders     None           Emil Share, [place] 12/23/23 [phone]

## 2023-12-25 ENCOUNTER — Other Ambulatory Visit: Payer: Self-pay

## 2023-12-25 DIAGNOSIS — M25571 Pain in right ankle and joints of right foot: Secondary | ICD-10-CM | POA: Diagnosis present

## 2023-12-25 DIAGNOSIS — J45909 Unspecified asthma, uncomplicated: Secondary | ICD-10-CM | POA: Diagnosis not present

## 2023-12-26 ENCOUNTER — Emergency Department (HOSPITAL_BASED_OUTPATIENT_CLINIC_OR_DEPARTMENT_OTHER)
Admission: EM | Admit: 2023-12-26 | Discharge: 2023-12-26 | Disposition: A | Discharge status: Home / Self Care | Attending: Emergency Medicine | Admitting: Emergency Medicine

## 2023-12-26 ENCOUNTER — Encounter (HOSPITAL_BASED_OUTPATIENT_CLINIC_OR_DEPARTMENT_OTHER): Payer: Self-pay

## 2023-12-26 DIAGNOSIS — M25571 Pain in right ankle and joints of right foot: Secondary | ICD-10-CM

## 2023-12-26 LAB — URIC ACID: Uric Acid, Serum: 7.2 mg/dL (ref 3.7–8.6)

## 2023-12-26 MED ORDER — KETOROLAC TROMETHAMINE 30 MG/ML IJ SOLN
30.0000 mg | Freq: Once | INTRAMUSCULAR | Status: AC
  Administered 2023-12-26: 30 mg via INTRAMUSCULAR
  Filled 2023-12-26: qty 1

## 2023-12-26 MED ORDER — OXYCODONE-ACETAMINOPHEN 5-325 MG PO TABS
1.0000 | ORAL_TABLET | Freq: Once | ORAL | Status: AC
  Administered 2023-12-26: 1 via ORAL
  Filled 2023-12-26: qty 1

## 2023-12-26 MED ORDER — OXYCODONE-ACETAMINOPHEN 5-325 MG PO TABS: 1.0000 | ORAL_TABLET | Freq: Four times a day (QID) | ORAL | 0 refills | Status: AC | PRN

## 2023-12-26 MED ORDER — METHYLPREDNISOLONE 4 MG PO TBPK: ORAL_TABLET | ORAL | 0 refills | Status: AC

## 2023-12-26 NOTE — ED Triage Notes (Signed)
 Pt states that he has pain and swelling to R ankle, seen here for the same 2 days ago, placed in a boot and swelling and pain is now worse.

## 2023-12-26 NOTE — Discharge Instructions (Signed)
 You were seen today for ankle pain.  This is relatively atraumatic.  I suspect you may have an inflammatory arthritis such as gout.  Avoid pork products and alcohol.  Make sure that you are staying hydrated.  Keep ankle iced and elevated.  Take medications as prescribed.  Follow-up with orthopedics.

## 2023-12-26 NOTE — ED Provider Notes (Signed)
 [name] EMERGENCY DEPARTMENT AT [facility] Provider Note   CSN: [id number] Arrival date & time: 12/25/23  2354     Patient presents with: Ankle Pain   Stephen Burnett is a 28 y.o. male.   HPI     This is a 28 year old male who presents with ongoing right ankle pain.  Patient reports relatively right ankle pain and swelling.  Was seen and evaluated several days ago for the same.  Denies any significant injury although he states that he felt pain fairly acutely after stepping inside.  He has noted increased swelling of that ankle.  No history of inflammatory arthritis or gout.  He does eat a significant amount of pork products but does not drink alcohol.  Has never needed other joints to swell.  He has not had any fevers.  Has not noticed any redness.  Has been taking ibuprofen with minimal relief.  Prior to Admission medications   Medication Sig Start Date End Date Taking? Authorizing Provider  methylPREDNISolone (MEDROL DOSEPAK) 4 MG TBPK tablet Take as directed on packet 12/26/23  Yes Mainor Hellmann, Charmaine FALCON, MD  oxyCODONE-acetaminophen (PERCOCET/ROXICET) 5-325 MG tablet Take 1 tablet by mouth every 6 (six) hours as needed for severe pain (pain score 7-10). 12/26/23  Yes Shaena Parkerson, Charmaine FALCON, MD  albuterol  (PROVENTIL ) (2.5 MG/3ML) 0.083% nebulizer solution Take 3 mLs (2.5 mg total) by nebulization every 6 (six) hours as needed for wheezing or shortness of breath. 06/13/20   Parrett, Madelin RAMAN, NP  albuterol  (VENTOLIN  HFA) 108 (90 Base) MCG/ACT inhaler Inhale 2 puffs into the lungs every 6 (six) hours as needed for wheezing or shortness of breath. 08/18/22   Tanda Bleacher, MD  EPINEPHrine  0.3 mg/0.3 mL IJ SOAJ injection Inject 0.3 mg into the muscle as needed for anaphylaxis. 08/25/23   Leavy Lucas Fox, PA-C  FLUoxetine  (PROZAC ) 20 MG tablet Take 1 tablet (20 mg total) by mouth daily. 08/25/23   Leavy Lucas Fox, PA-C  Glecaprevir-Pibrentasvir (MAVYRET ) 100-40 MG TABS Take 3 tablets  by mouth daily after supper. 08/24/23   Waddell Alan PARAS, RPH-CPP  pantoprazole  (PROTONIX ) 40 MG tablet Take 1 tablet (40 mg total) by mouth 2 (two) times daily before a meal. 07/09/23   Pollina, Lonni PARAS, MD    Allergies: Banana and Other    Review of Systems  Constitutional:  Negative for fever.  Respiratory:  Negative for shortness of breath.   Cardiovascular:  Negative for chest pain.  Musculoskeletal:        Ankle pain and swelling  All other systems reviewed and are negative.   Updated Vital Signs BP (!) 159/91   Pulse 60   Temp 98.2 F (36.8 C) (Oral)   Resp 16   SpO2 98%   Physical Exam Vitals and nursing note reviewed.  Constitutional:      Appearance: He is well-developed. He is obese. He is not ill-appearing.  HENT:     Head: Normocephalic and atraumatic.  Eyes:     Pupils: Pupils are equal, round, and reactive to light.  Cardiovascular:     Rate and Rhythm: Normal rate and regular rhythm.  Pulmonary:     Effort: Pulmonary effort is normal. No respiratory distress.  Abdominal:     Palpations: Abdomen is soft.  Musculoskeletal:     Cervical back: Neck supple.     Comments: Diffuse swelling of the right ankle, no overlying skin changes or erythema, limited range of motion secondary to pain, 2+ DP pulse  Lymphadenopathy:  Cervical: No cervical adenopathy.  Skin:    General: Skin is warm and dry.  Neurological:     Mental Status: He is alert and oriented to person, place, and time.  Psychiatric:        Mood and Affect: Mood normal.     (all labs ordered are listed, but only abnormal results are displayed) Labs Reviewed  URIC ACID    EKG: None  Radiology: No results found.   Procedures   Medications Ordered in the ED  ketorolac  (TORADOL ) 30 MG/ML injection 30 mg (30 mg Intramuscular Given 12/26/23 0013)  oxyCODONE-acetaminophen (PERCOCET/ROXICET) 5-325 MG per tablet 1 tablet (1 tablet Oral Given 12/26/23 0013)                                     Medical Decision Making Amount and/or Complexity of Data Reviewed Labs: ordered.  Risk Prescription drug management.   This patient presents to the ED for concern of ankle pain, this involves an extensive number of treatment options, and is a complaint that carries with it a high risk of complications and morbidity.  I considered the following differential and admission for this acute, potentially life threatening condition.  The differential diagnosis includes inflammatory arthritis such as gout, septic arthritis, injury  MDM:    This is a 28 year old male who presents with relatively atraumatic right ankle pain and swelling.  He is nontoxic.  Vital signs notable for blood pressure of 159/91.  He has swelling that is asymmetric to the right ankle.  No overlying skin changes.  No erythema or warmth.  No systemic symptoms to suggest septic arthritis.  High suspicion for inflammatory arthritis such as gout.  Patient was given Toradol  and oxycodone.  X-rays reviewed from prior evaluation and within normal limits.  Uric acid level is normal but this does not completely exclude possibility of gout.  Patient does not wish to pursue arthrocentesis at this time.  Will treat presumptively as gout and have him follow-up with orthopedics.  (Labs, imaging, consults)  Labs: I Ordered, and personally interpreted labs.  The pertinent results include: Uric acid  Imaging Studies ordered: I ordered imaging studies including reviewed prior imaging x-ray I independently visualized and interpreted imaging. I agree with the radiologist interpretation  Additional history obtained from chart review.  External records from outside source obtained and reviewed including prior evaluation  Cardiac Monitoring: The patient was not maintained on a cardiac monitor.  If on the cardiac monitor, I personally viewed and interpreted the cardiac monitored which showed an underlying rhythm of:  N/A  Reevaluation: After the interventions noted above, I reevaluated the patient and found that they have :improved  Social Determinants of Health:  lives independently  Disposition: Discharge  Co morbidities that complicate the patient evaluation  Past Medical History:  Diagnosis Date   Anxiety    Asthma    Chest pain    Palpitations      Medicines Meds ordered this encounter  Medications   ketorolac  (TORADOL ) 30 MG/ML injection 30 mg   oxyCODONE-acetaminophen (PERCOCET/ROXICET) 5-325 MG per tablet 1 tablet    Refill:  0   methylPREDNISolone (MEDROL DOSEPAK) 4 MG TBPK tablet    Sig: Take as directed on packet    Dispense:  21 tablet    Refill:  0   oxyCODONE-acetaminophen (PERCOCET/ROXICET) 5-325 MG tablet    Sig: Take 1 tablet by mouth every 6 (  six) hours as needed for severe pain (pain score 7-10).    Dispense:  10 tablet    Refill:  0    I have reviewed the patients home medicines and have made adjustments as needed  Problem List / ED Course: Problem List Items Addressed This Visit   None Visit Diagnoses       Acute right ankle pain    -  Primary                Final diagnoses:  Acute right ankle pain    ED Discharge Orders          Ordered    methylPREDNISolone (MEDROL DOSEPAK) 4 MG TBPK tablet        12/26/23 0123    oxyCODONE-acetaminophen (PERCOCET/ROXICET) 5-325 MG tablet  Every 6 hours PRN        12/26/23 0123               Bari Charmaine FALCON, MD 12/26/23 0128

## 2024-02-25 ENCOUNTER — Ambulatory Visit: Payer: Self-pay | Admitting: Family Medicine

## 2024-02-25 ENCOUNTER — Encounter: Payer: Self-pay | Admitting: Family Medicine

## 2024-02-25 VITALS — BP 127/89 | HR 73 | Ht 75.0 in | Wt 313.0 lb

## 2024-02-25 DIAGNOSIS — Z6839 Body mass index (BMI) 39.0-39.9, adult: Secondary | ICD-10-CM

## 2024-02-25 DIAGNOSIS — K219 Gastro-esophageal reflux disease without esophagitis: Secondary | ICD-10-CM

## 2024-02-25 DIAGNOSIS — E66812 Obesity, class 2: Secondary | ICD-10-CM

## 2024-02-25 DIAGNOSIS — E6609 Other obesity due to excess calories: Secondary | ICD-10-CM

## 2024-02-25 DIAGNOSIS — J4520 Mild intermittent asthma, uncomplicated: Secondary | ICD-10-CM

## 2024-02-25 DIAGNOSIS — F411 Generalized anxiety disorder: Secondary | ICD-10-CM

## 2024-02-25 DIAGNOSIS — Z720 Tobacco use: Secondary | ICD-10-CM

## 2024-02-25 MED ORDER — FLUOXETINE HCL 20 MG PO TABS: 20.0000 mg | ORAL_TABLET | Freq: Every day | ORAL | 1 refills | Status: AC

## 2024-02-25 MED ORDER — PANTOPRAZOLE SODIUM 40 MG PO TBEC: 40.0000 mg | DELAYED_RELEASE_TABLET | Freq: Every day | ORAL | 1 refills | Status: AC

## 2024-02-25 NOTE — Progress Notes (Signed)
 "  Established Patient Office Visit  Subjective    Patient ID: Stephen Burnett, male    DOB: Dec 16, 1995  Age: 29 y.o. MRN: [id number]  CC:  Chief Complaint  Patient presents with   Medical Management of Chronic Issues    HPI Kongmeng Santoro presents for follow up of chronic med issues including anxiety and GERD. Patient reports med compliance and denies acute complaints.   Outpatient Encounter Medications as of 02/25/2024  Medication Sig   albuterol  (PROVENTIL ) (2.5 MG/3ML) 0.083% nebulizer solution Take 3 mLs (2.5 mg total) by nebulization every 6 (six) hours as needed for wheezing or shortness of breath.   albuterol  (VENTOLIN  HFA) 108 (90 Base) MCG/ACT inhaler Inhale 2 puffs into the lungs every 6 (six) hours as needed for wheezing or shortness of breath.   EPINEPHrine  0.3 mg/0.3 mL IJ SOAJ injection Inject 0.3 mg into the muscle as needed for anaphylaxis.   [DISCONTINUED] pantoprazole  (PROTONIX ) 40 MG tablet Take 1 tablet (40 mg total) by mouth 2 (two) times daily before a meal.   FLUoxetine  (PROZAC ) 20 MG tablet Take 1 tablet (20 mg total) by mouth daily.   Glecaprevir-Pibrentasvir (MAVYRET ) 100-40 MG TABS Take 3 tablets by mouth daily after supper. (Patient not taking: Reported on 02/25/2024)   methylPREDNISolone  (MEDROL  DOSEPAK) 4 MG TBPK tablet Take as directed on packet   oxyCODONE -acetaminophen  (PERCOCET/ROXICET) 5-325 MG tablet Take 1 tablet by mouth every 6 (six) hours as needed for severe pain (pain score 7-10). (Patient not taking: Reported on 02/25/2024)   pantoprazole  (PROTONIX ) 40 MG tablet Take 1 tablet (40 mg total) by mouth daily.   [DISCONTINUED] FLUoxetine  (PROZAC ) 20 MG tablet Take 1 tablet (20 mg total) by mouth daily.   No facility-administered encounter medications on file as of 02/25/2024.    Past Medical History:  Diagnosis Date   Anxiety    Asthma    Chest pain    Palpitations     History reviewed. No pertinent surgical history.  Family History  Problem  Relation Age of Onset   Healthy Mother    Heart attack Father     Social History   Socioeconomic History   Marital status: Married    Spouse name: Not on file   Number of children: Not on file   Years of education: Not on file   Highest education level: 12th grade  Occupational History   Not on file  Tobacco Use   Smoking status: Every Day    Current packs/day: 0.00    Average packs/day: 1 pack/day for 17.0 years (17.0 ttl pk-yrs)    Types: Cigarettes, E-cigarettes    Start date: 12/10/2003    Last attempt to quit: 12/09/2020    Years since quitting: 3.2   Smokeless tobacco: Current    Types: Snuff   Tobacco comments:    1/2 pack-pack per day  Vaping Use   Vaping status: Every Day  Substance and Sexual Activity   Alcohol use: Not Currently    Comment: rarely    Drug use: Not Currently    Types: Methamphetamines, IV    Comment: 2018   Sexual activity: Yes  Other Topics Concern   Not on file  Social History Narrative   Not on file   Social Drivers of Health   Tobacco Use: High Risk (02/25/2024)   Patient History    Smoking Tobacco Use: Every Day    Smokeless Tobacco Use: Current    Passive Exposure: Not on file  Financial Resource Strain: Low  Risk (02/21/2024)   Overall Financial Resource Strain (CARDIA)    Difficulty of Paying Living Expenses: Not very hard  Food Insecurity: No Food Insecurity (02/21/2024)   Epic    Worried About Programme Researcher, Broadcasting/film/video in the Last Year: Never true    Ran Out of Food in the Last Year: Never true  Transportation Needs: No Transportation Needs (02/21/2024)   Epic    Lack of Transportation (Medical): No    Lack of Transportation (Non-Medical): No  Physical Activity: Sufficiently Active (02/21/2024)   Exercise Vital Sign    Days of Exercise per Week: 6 days    Minutes of Exercise per Session: 110 min  Stress: No Stress Concern Present (02/21/2024)   [employer] of Occupational Health - Occupational Stress Questionnaire     Feeling of Stress: Not at all  Social Connections: Moderately Integrated (02/21/2024)   Social Connection and Isolation Panel    Frequency of Communication with Friends and Family: More than three times a week    Frequency of Social Gatherings with Friends and Family: Never    Attends Religious Services: 1 to 4 times per year    Active Member of [employer] or Organizations: No    Attends Engineer, Structural: Not on file    Marital Status: Married  Catering Manager Violence: Not At Risk (02/18/2023)   Humiliation, Afraid, Rape, and Kick questionnaire    Fear of Current or Ex-Partner: No    Emotionally Abused: No    Physically Abused: No    Sexually Abused: No  Depression (PHQ2-9): Low Risk (08/25/2023)   Depression (PHQ2-9)    PHQ-2 Score: 0  Recent Concern: Depression (PHQ2-9) - Medium Risk (07/13/2023)   Depression (PHQ2-9)    PHQ-2 Score: 6  Alcohol Screen: Low Risk (02/21/2024)   Alcohol Screen    Last Alcohol Screening Score (AUDIT): 0  Housing: Low Risk (02/21/2024)   Epic    Unable to Pay for Housing in the Last Year: No    Number of Times Moved in the Last Year: 0    Homeless in the Last Year: No  Utilities: Not At Risk (02/18/2023)   [facility] Utilities    Threatened with loss of utilities: No  Health Literacy: Adequate Health Literacy (02/18/2023)   B1300 Health Literacy    Frequency of need for help with medical instructions: Never    Review of Systems  All other systems reviewed and are negative.       Objective    BP 127/89   Pulse 73   Ht 6' 3 (1.905 m)   Wt (!) 313 lb (142 kg)   SpO2 95%   BMI 39.12 kg/m   Physical Exam Vitals and nursing note reviewed.  Constitutional:      General: He is not in acute distress.    Appearance: He is obese.  Cardiovascular:     Rate and Rhythm: Normal rate and regular rhythm.  Pulmonary:     Effort: Pulmonary effort is normal.     Breath sounds: Normal breath sounds.  Neurological:     General: No focal deficit  present.     Mental Status: He is alert and oriented to person, place, and time.  Psychiatric:        Mood and Affect: Mood normal.        Behavior: Behavior normal.         Assessment & Plan:   GAD (generalized anxiety disorder) -     FLUoxetine  HCl; Take  1 tablet (20 mg total) by mouth daily.  Dispense: 90 tablet; Refill: 1  Gastroesophageal reflux disease without esophagitis  Vapes nicotine containing substance  Mild intermittent reactive airway disease without complication  Class 2 obesity due to excess calories without serious comorbidity with body mass index (BMI) of 39.0 to 39.9 in adult  Other orders -     Pantoprazole  Sodium; Take 1 tablet (40 mg total) by mouth daily.  Dispense: 90 tablet; Refill: 1     No follow-ups on file.   Tanda Raguel SQUIBB, MD  "

## 2024-08-29 ENCOUNTER — Ambulatory Visit: Payer: Self-pay | Admitting: Family Medicine
